# Patient Record
Sex: Female | Born: 1985 | Race: White | Hispanic: No | Marital: Single | Smoking: Current every day smoker
Health system: Southern US, Community
[De-identification: ages and names within clinical notes are randomized; demographics above are authoritative.]

## PROBLEM LIST (undated history)

## (undated) ENCOUNTER — Inpatient Hospital Stay (HOSPITAL_COMMUNITY): Payer: Self-pay

## (undated) DIAGNOSIS — R51 Headache: Secondary | ICD-10-CM

## (undated) DIAGNOSIS — R569 Unspecified convulsions: Secondary | ICD-10-CM

## (undated) DIAGNOSIS — O139 Gestational [pregnancy-induced] hypertension without significant proteinuria, unspecified trimester: Secondary | ICD-10-CM

## (undated) DIAGNOSIS — F32A Depression, unspecified: Secondary | ICD-10-CM

## (undated) DIAGNOSIS — F445 Conversion disorder with seizures or convulsions: Secondary | ICD-10-CM

## (undated) DIAGNOSIS — F329 Major depressive disorder, single episode, unspecified: Secondary | ICD-10-CM

---
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
## 2005-12-15 ENCOUNTER — Observation Stay: Payer: Self-pay | Admitting: Obstetrics and Gynecology

## 2005-12-15 ENCOUNTER — Inpatient Hospital Stay (HOSPITAL_COMMUNITY): Admission: AD | Admit: 2005-12-15 | Discharge: 2005-12-17 | Payer: Self-pay | Admitting: Gynecology

## 2005-12-25 ENCOUNTER — Inpatient Hospital Stay (HOSPITAL_COMMUNITY): Admission: AD | Admit: 2005-12-25 | Discharge: 2006-01-29 | Payer: Self-pay | Admitting: Obstetrics and Gynecology

## 2006-01-26 ENCOUNTER — Encounter (INDEPENDENT_AMBULATORY_CARE_PROVIDER_SITE_OTHER): Payer: Self-pay | Admitting: Specialist

## 2006-03-22 ENCOUNTER — Other Ambulatory Visit: Admission: RE | Admit: 2006-03-22 | Discharge: 2006-03-22 | Payer: Self-pay | Admitting: Obstetrics and Gynecology

## 2007-06-14 ENCOUNTER — Emergency Department: Payer: Self-pay | Admitting: Emergency Medicine

## 2007-07-29 ENCOUNTER — Emergency Department (HOSPITAL_COMMUNITY): Admission: EM | Admit: 2007-07-29 | Discharge: 2007-07-29 | Payer: Self-pay | Admitting: Emergency Medicine

## 2008-03-16 ENCOUNTER — Emergency Department (HOSPITAL_COMMUNITY): Admission: EM | Admit: 2008-03-16 | Discharge: 2008-03-16 | Payer: Self-pay | Admitting: Emergency Medicine

## 2008-04-16 ENCOUNTER — Emergency Department (HOSPITAL_COMMUNITY): Admission: EM | Admit: 2008-04-16 | Discharge: 2008-04-16 | Payer: Self-pay | Admitting: Diagnostic Radiology

## 2008-04-18 ENCOUNTER — Emergency Department (HOSPITAL_COMMUNITY): Admission: EM | Admit: 2008-04-18 | Discharge: 2008-04-18 | Payer: Self-pay | Admitting: Emergency Medicine

## 2008-11-08 ENCOUNTER — Emergency Department: Payer: Self-pay | Admitting: Emergency Medicine

## 2009-06-21 ENCOUNTER — Emergency Department (HOSPITAL_COMMUNITY): Admission: EM | Admit: 2009-06-21 | Discharge: 2009-06-21 | Payer: Self-pay | Admitting: Emergency Medicine

## 2009-09-08 ENCOUNTER — Emergency Department (HOSPITAL_COMMUNITY): Admission: EM | Admit: 2009-09-08 | Discharge: 2009-09-08 | Payer: Self-pay | Admitting: Emergency Medicine

## 2009-09-15 ENCOUNTER — Emergency Department: Payer: Self-pay | Admitting: Emergency Medicine

## 2010-01-21 ENCOUNTER — Emergency Department (HOSPITAL_COMMUNITY): Admission: EM | Admit: 2010-01-21 | Discharge: 2010-01-22 | Payer: Self-pay | Admitting: Emergency Medicine

## 2010-04-04 ENCOUNTER — Emergency Department (HOSPITAL_COMMUNITY)
Admission: EM | Admit: 2010-04-04 | Discharge: 2010-04-04 | Payer: Self-pay | Source: Home / Self Care | Admitting: Emergency Medicine

## 2010-06-17 LAB — DIFFERENTIAL
Basophils Absolute: 0 10*3/uL (ref 0.0–0.1)
Eosinophils Relative: 4 % (ref 0–5)
Lymphocytes Relative: 31 % (ref 12–46)
Neutro Abs: 5.4 10*3/uL (ref 1.7–7.7)
Neutrophils Relative %: 58 % (ref 43–77)

## 2010-06-17 LAB — CBC
Platelets: 210 10*3/uL (ref 150–400)
RBC: 4.46 MIL/uL (ref 3.87–5.11)
RDW: 13.2 % (ref 11.5–15.5)
WBC: 9.3 10*3/uL (ref 4.0–10.5)

## 2010-06-17 LAB — BASIC METABOLIC PANEL
BUN: 7 mg/dL (ref 6–23)
Calcium: 9 mg/dL (ref 8.4–10.5)
Creatinine, Ser: 0.63 mg/dL (ref 0.4–1.2)
GFR calc Af Amer: >60 mL/min (ref >60)
GFR calc non Af Amer: >60 mL/min (ref >60)

## 2010-06-17 LAB — ACETAMINOPHEN LEVEL: Acetaminophen (Tylenol), Serum: <10 ug/mL — ABNORMAL LOW (ref 10–30)

## 2010-06-17 LAB — RAPID URINE DRUG SCREEN, HOSP PERFORMED
Barbiturates: NOT DETECTED
Cocaine: NOT DETECTED
Opiates: NOT DETECTED

## 2010-06-17 LAB — URINALYSIS, ROUTINE W REFLEX MICROSCOPIC
Glucose, UA: NEGATIVE mg/dL
Leukocytes, UA: NEGATIVE
Nitrite: NEGATIVE
Specific Gravity, Urine: 1.01 (ref 1.005–1.030)
pH: 5.5 (ref 5.0–8.0)

## 2010-06-17 LAB — PREGNANCY, URINE: Preg Test, Ur: NEGATIVE

## 2010-06-17 LAB — ETHANOL: Alcohol, Ethyl (B): 83 mg/dL — ABNORMAL HIGH (ref 0–10)

## 2010-06-17 LAB — SALICYLATE LEVEL: Salicylate Lvl: <4 mg/dL (ref 2.8–20.0)

## 2010-06-22 LAB — SALICYLATE LEVEL: Salicylate Lvl: <4 mg/dL (ref 2.8–20.0)

## 2010-06-22 LAB — BASIC METABOLIC PANEL
BUN: 11 mg/dL (ref 6–23)
Chloride: 105 mEq/L (ref 96–112)
GFR calc non Af Amer: >60 mL/min (ref >60)
Glucose, Bld: 145 mg/dL — ABNORMAL HIGH (ref 70–99)
Potassium: 2.9 mEq/L — ABNORMAL LOW (ref 3.5–5.1)

## 2010-06-22 LAB — RAPID URINE DRUG SCREEN, HOSP PERFORMED
Amphetamines: NOT DETECTED
Barbiturates: NOT DETECTED
Benzodiazepines: POSITIVE — AB
Tetrahydrocannabinol: POSITIVE — AB

## 2010-06-22 LAB — DIFFERENTIAL
Basophils Absolute: 0 10*3/uL (ref 0.0–0.1)
Eosinophils Absolute: 0.4 10*3/uL (ref 0.0–0.7)
Eosinophils Relative: 3 % (ref 0–5)

## 2010-06-22 LAB — CBC
HCT: 39.6 % (ref 36.0–46.0)
MCV: 96.2 fL (ref 78.0–100.0)
Platelets: 207 10*3/uL (ref 150–400)
RDW: 12.6 % (ref 11.5–15.5)

## 2010-06-22 LAB — ACETAMINOPHEN LEVEL: Acetaminophen (Tylenol), Serum: <10 ug/mL — ABNORMAL LOW (ref 10–30)

## 2010-06-28 LAB — DIFFERENTIAL
Basophils Absolute: 0 10*3/uL (ref 0.0–0.1)
Basophils Relative: 1 % (ref 0–1)
Eosinophils Absolute: 0 10*3/uL (ref 0.0–0.7)
Eosinophils Relative: 0 % (ref 0–5)
Lymphocytes Relative: 14 % (ref 12–46)
Lymphs Abs: 1.5 10*3/uL (ref 0.7–4.0)
Monocytes Absolute: 0.9 10*3/uL (ref 0.1–1.0)
Monocytes Relative: 9 % (ref 3–12)
Neutro Abs: 8.1 10*3/uL — ABNORMAL HIGH (ref 1.7–7.7)
Neutrophils Relative %: 76 % (ref 43–77)

## 2010-06-28 LAB — URINALYSIS, ROUTINE W REFLEX MICROSCOPIC
Bilirubin Urine: NEGATIVE
Glucose, UA: NEGATIVE mg/dL
Hgb urine dipstick: NEGATIVE
Leukocytes, UA: NEGATIVE
pH: 7 (ref 5.0–8.0)

## 2010-06-28 LAB — URINE MICROSCOPIC-ADD ON

## 2010-06-28 LAB — COMPREHENSIVE METABOLIC PANEL
ALT: 14 U/L (ref 0–35)
AST: 23 U/L (ref 0–37)
Albumin: 4.3 g/dL (ref 3.5–5.2)
Alkaline Phosphatase: 50 U/L (ref 39–117)
BUN: 12 mg/dL (ref 6–23)
CO2: 23 mEq/L (ref 19–32)
Calcium: 9 mg/dL (ref 8.4–10.5)
Chloride: 106 mEq/L (ref 96–112)
Creatinine, Ser: 0.75 mg/dL (ref 0.4–1.2)
GFR calc Af Amer: >60 mL/min (ref >60)
GFR calc non Af Amer: >60 mL/min (ref >60)
Glucose, Bld: 81 mg/dL (ref 70–99)
Potassium: 3.3 mEq/L — ABNORMAL LOW (ref 3.5–5.1)
Sodium: 143 mEq/L (ref 135–145)
Total Bilirubin: 1 mg/dL (ref 0.3–1.2)
Total Protein: 6.7 g/dL (ref 6.0–8.3)

## 2010-06-28 LAB — RAPID URINE DRUG SCREEN, HOSP PERFORMED
Amphetamines: NOT DETECTED
Barbiturates: NOT DETECTED
Benzodiazepines: POSITIVE — AB
Cocaine: POSITIVE — AB
Opiates: NOT DETECTED
Tetrahydrocannabinol: POSITIVE — AB

## 2010-06-28 LAB — SALICYLATE LEVEL: Salicylate Lvl: <4 mg/dL (ref 2.8–20.0)

## 2010-06-28 LAB — CBC
HCT: 41.1 % (ref 36.0–46.0)
Platelets: 211 10*3/uL (ref 150–400)
RDW: 13 % (ref 11.5–15.5)

## 2010-06-28 LAB — ETHANOL: Alcohol, Ethyl (B): <5 mg/dL (ref 0–10)

## 2010-06-28 LAB — ACETAMINOPHEN LEVEL: Acetaminophen (Tylenol), Serum: <10 ug/mL — ABNORMAL LOW (ref 10–30)

## 2010-07-20 LAB — URINE MICROSCOPIC-ADD ON

## 2010-07-20 LAB — URINALYSIS, ROUTINE W REFLEX MICROSCOPIC
Glucose, UA: NEGATIVE mg/dL
Leukocytes, UA: NEGATIVE
pH: 6 (ref 5.0–8.0)

## 2010-08-21 ENCOUNTER — Emergency Department (HOSPITAL_COMMUNITY): Payer: Medicaid Other

## 2010-08-21 ENCOUNTER — Inpatient Hospital Stay (HOSPITAL_COMMUNITY)
Admission: EM | Admit: 2010-08-21 | Discharge: 2010-08-22 | DRG: 101 | Disposition: A | Discharge status: Home / Self Care | Payer: Medicaid Other | Attending: Internal Medicine | Admitting: Internal Medicine

## 2010-08-21 DIAGNOSIS — R569 Unspecified convulsions: Principal | ICD-10-CM | POA: Diagnosis present

## 2010-08-21 DIAGNOSIS — N390 Urinary tract infection, site not specified: Secondary | ICD-10-CM | POA: Diagnosis present

## 2010-08-21 DIAGNOSIS — F1010 Alcohol abuse, uncomplicated: Secondary | ICD-10-CM | POA: Diagnosis present

## 2010-08-21 DIAGNOSIS — R42 Dizziness and giddiness: Secondary | ICD-10-CM | POA: Diagnosis present

## 2010-08-21 DIAGNOSIS — F319 Bipolar disorder, unspecified: Secondary | ICD-10-CM | POA: Diagnosis present

## 2010-08-21 LAB — ACETAMINOPHEN LEVEL: Acetaminophen (Tylenol), Serum: <15 ug/mL (ref 10–30)

## 2010-08-21 LAB — DIFFERENTIAL
Eosinophils Absolute: 0.2 10*3/uL (ref 0.0–0.7)
Eosinophils Relative: 3 % (ref 0–5)
Lymphs Abs: 2.7 10*3/uL (ref 0.7–4.0)
Monocytes Relative: 6 % (ref 3–12)

## 2010-08-21 LAB — URINALYSIS, ROUTINE W REFLEX MICROSCOPIC
Glucose, UA: NEGATIVE mg/dL
Ketones, ur: NEGATIVE mg/dL
Leukocytes, UA: NEGATIVE
Nitrite: POSITIVE — AB
Specific Gravity, Urine: 1.015 (ref 1.005–1.030)
pH: 6 (ref 5.0–8.0)

## 2010-08-21 LAB — COMPREHENSIVE METABOLIC PANEL
AST: 26 U/L (ref 0–37)
Albumin: 4.2 g/dL (ref 3.5–5.2)
Chloride: 108 mEq/L (ref 96–112)
Creatinine, Ser: 0.53 mg/dL (ref 0.4–1.2)
GFR calc Af Amer: >60 mL/min (ref >60)
Total Bilirubin: 0.9 mg/dL (ref 0.3–1.2)
Total Protein: 7.4 g/dL (ref 6.0–8.3)

## 2010-08-21 LAB — RAPID URINE DRUG SCREEN, HOSP PERFORMED
Benzodiazepines: POSITIVE — AB
Tetrahydrocannabinol: POSITIVE — AB

## 2010-08-21 LAB — CBC
MCH: 32.3 pg (ref 26.0–34.0)
MCV: 94.2 fL (ref 78.0–100.0)
Platelets: 250 10*3/uL (ref 150–400)
RBC: 4.46 MIL/uL (ref 3.87–5.11)
RDW: 13.6 % (ref 11.5–15.5)

## 2010-08-21 LAB — URINE MICROSCOPIC-ADD ON

## 2010-08-21 NOTE — Discharge Summary (Signed)
Gabriela Taylor, Gabriela Taylor             ACCOUNT NO.:  [id number]   MEDICAL RECORD NO.:  [id number]          PATIENT TYPE:  INP   LOCATION:  9149                          [facility]   PHYSICIAN:  James A. Ashley Royalty, M.D.DATE OF BIRTH:  August 02, 1985   DATE OF ADMISSION:  12/15/2005  DATE OF DISCHARGE:  12/17/2005                                 DISCHARGE SUMMARY   DISCHARGE DIAGNOSIS:  1. Intrauterine pregnancy at 30 weeks' gestation, undelivered.  2. Placenta previa  3. Preterm contractions.  4. Bleeding -- secondary to #2.   OPERATIONS/SPECIAL PROCEDURES:  None.   CONSULTATIONS:  None.   DISCHARGE MEDICATIONS:  1. Terbutaline 5 mg every 4 hours.  2. Prenatal vitamins.   HISTORY AND PHYSICAL:  This is a 25 year old gravida 1 at approximately 47  weeks' gestation.  The patient experienced bleeding while at [facility] in  the [facility] area near  [place].  She chose to go to [facility] on December 15, 2005.  She was subsequently noted to be stable and  subsequently was transferred to [facility] later on the same day.  She  was given one dose of steroids at [facility] prior to transfer.  For  the remainder of the history and physical please see chart.   HOSPITAL COURSE:  The patient was admitted to [facility] in  [place].  Admission laboratory studies were drawn.  She was given  terbutaline by Dr. Sydnee Cabal and also a second dose of betamethasone on  December 16, 2005.  The patient's contractions abated.  Her bleeding abated  as well.  On December 17, 2005 she was felt to be stable for discharge and  was discharged home afebrile and in satisfactory condition.   ACCESSORY CLINICAL FINDINGS:  Hemoglobin on December 15, 2005 at [facility]  revealed a value of 12.9.   DISPOSITION:  The patient is to return to [facility] and Obstetrics  in approximately 1-2 weeks for followup.  Will obtain ultrasound in near  future to determine if the  placenta previa persists as it is known not to be  a central previa.  Full precautions were given including avoidance of any  sexual activity whatsoever, staying close to [facility], and to report  any significant vaginal bleeding.  The patient states she understands and  accepts and will comply.     James A. Ashley Royalty, M.D.  Electronically Signed    JAM/MEDQ  D:  12/17/2005  T:  12/18/2005  Job:  [id number]

## 2010-08-21 NOTE — Discharge Summary (Signed)
Gabriela Taylor, Gabriela Taylor             ACCOUNT NO.:  [id number]   MEDICAL RECORD NO.:  [id number]          PATIENT TYPE:  INP   LOCATION:  9124                          [facility]   PHYSICIAN:  James A. Ashley Royalty, M.D.DATE OF BIRTH:  1985/11/14   DATE OF ADMISSION:  12/25/2005  DATE OF DISCHARGE:  01/29/2006                               DISCHARGE SUMMARY   DISCHARGE DIAGNOSES:  1. Intrauterine pregnancy at 36 weeks, delivered.  2. Placenta previa.   OPERATIONS AND PROCEDURES:  Primary low transverse cesarean section.   CONSULTATIONS:  Dr. Margot Ables (maternal fetal medicine).   DISCHARGE MEDICATIONS:  Percocet and Motrin 600 mg.   HISTORY AND PHYSICAL:  This is a 26 year old primigravida at 31-weeks 3-  days gestation at the time of admission.  The patient was admitted with  her second episode of vaginal bleeding pursuant to placenta previa.  She  had previously received betamethasone.  For the remainder of the history  and physical, please see chart.   HOSPITAL COURSE:  The patient was admitted to [facility] of  [place].  Initial laboratory studies were drawn.  Maternal fetal  medicine was consulted on December 29, 2005.  Decision was made to  maintain the patient in the hospital until delivery.  It was further  agreed that if she made it to 36 weeks she would undergo primary low  transverse cesarean section.  As she did make it to 36 weeks, she was  taken to the operating room on January 26, 2006 and underwent primary  low transverse cesarean section.  Procedure was accomplished by Dr.  Sylvester Harder with Dr. Richardson Dopp assisting.  It yielded a 5-pound 1-ounce  female, Apgars 8 at one minute and 9 at five minutes, sent to the newborn  nursery.  The patient's postoperative course was benign.  She was  discharged on the third postoperative day afebrile and in satisfactory  condition.   DISPOSITION:  The patient is to return to [facility] and  Obstetrics in 4-6 weeks  for postpartum evaluation.      James A. Ashley Royalty, M.D.  Electronically Signed     JAM/MEDQ  D:  03/10/2006  T:  03/10/2006  Job:  [id number]

## 2010-08-21 NOTE — Op Note (Signed)
NAMEGEARLDINE, Gabriela Taylor             ACCOUNT NO.:  [id number]   MEDICAL RECORD NO.:  [id number]          PATIENT TYPE:  INP   LOCATION:  NA                            [facility]   PHYSICIAN:  James A. Ashley Royalty, M.D.DATE OF BIRTH:  [birth year]   DATE OF PROCEDURE:  01/26/2006  DATE OF DISCHARGE:                                 OPERATIVE REPORT   PREOPERATIVE DIAGNOSES:  1. Intrauterine pregnancy at 94 with weeks' gestation.  2. Placenta previa.   POSTOPERATIVE DIAGNOSES:  1. Intrauterine pregnancy at 75 with weeks' gestation.  2. Placenta previa.  3. Path pending.   PROCEDURE:  Primary low transverse cesarean section.   SURGEON:  Rudy Jew. Ashley Royalty, M.D.   ASSISTANT:  Gerald Leitz, MD.   FINDINGS:  A 5 pound 1 ounce female, Apgars 8 at 1 minute and 9 at 5 minutes,  sent to newborn nursery.   ESTIMATED BLOOD LOSS:  600 mL.   COMPLICATIONS:  None.   PACKS AND DRAINS:  [place].   Sponge, needle and instrument count reported as correct x2.   PROCEDURE:  The patient was taken to the operating room and placed in the  sitting position.  After spinal anesthetic was administered, she was placed  in the dorsal supine position and prepped and draped in the usual manner for  abdominal surgery.  Pottenger catheter was placed.  A Pfannenstiel incision was  made down to the level of the fascia which was nicked with a knife and  incised transversely with Mayo scissors.  The underlying rectus muscles were  separated from the fascia using sharp and blunt dissection.  Rectus muscles  were separated in the midline exposing the peritoneum which was elevated  with hemostats and entered atraumatically with Metzenbaum scissors.  Incision was extended longitudinally.  The uterus was identified and bladder  flap created by incising the anterior uterine serosa and sharply and bluntly  dissecting the bladder inferiorly.  It was held in place with a bladder  blade.  The uterus was then entered through a low  transverse incision using  sharp and blunt dissection.  The placenta was not encountered as it was on  the posterior wall of the uterus.  The fluid was noted be clear.  The infant  was delivered from vertex presentation in an atraumatic manner.  The infant  was suctioned.  The cord was doubly clamped, cut and infant given  immediately to the awaiting pediatric team.  The cord blood was obtained and  placenta and membranes removed in their entirety and submitted to pathology  for histologic studies.  The uterus was exteriorized.  The uterus was then  closed in two running layers of #1 Vicryl.  The first was a running locking  layer.  The second was a running, intermittently locking, and imbricating  layer.  One additional figure-of-eight suture was required to obtain  hemostasis.  Hemostasis was noted.  Uterus, tubes and ovaries were inspected  and found to be otherwise normal.  They were returned to the abdominal  cavity.  Copious irrigation was accomplished.  Hemostasis was noted.  The  peritoneum was then closed with  3-0 Vicryl.  The fascia was closed with 0  Vicryl.  The skin was closed with staples.   The patient tolerated the procedure extremely well and was returned to the  recovery room in good condition.  At the conclusion of the procedure, the  urine was clear and copious.      James A. Ashley Royalty, M.D.  Electronically Signed     JAM/MEDQ  D:  01/26/2006  T:  01/27/2006  Job:  [id number]

## 2010-08-22 ENCOUNTER — Inpatient Hospital Stay (HOSPITAL_COMMUNITY): Payer: Medicaid Other

## 2010-08-22 LAB — CBC
HCT: 39.6 % (ref 36.0–46.0)
Hemoglobin: 13 g/dL (ref 12.0–15.0)
RBC: 4.14 MIL/uL (ref 3.87–5.11)
WBC: 6.9 10*3/uL (ref 4.0–10.5)

## 2010-08-22 LAB — COMPREHENSIVE METABOLIC PANEL
AST: 34 U/L (ref 0–37)
BUN: 9 mg/dL (ref 6–23)
CO2: 29 mEq/L (ref 19–32)
Calcium: 8.3 mg/dL — ABNORMAL LOW (ref 8.4–10.5)
Creatinine, Ser: 0.56 mg/dL (ref 0.4–1.2)
GFR calc Af Amer: >60 mL/min (ref >60)
GFR calc non Af Amer: >60 mL/min (ref >60)
Total Bilirubin: 0.4 mg/dL (ref 0.3–1.2)

## 2010-08-22 LAB — DIFFERENTIAL
Basophils Absolute: 0 10*3/uL (ref 0.0–0.1)
Basophils Relative: 1 % (ref 0–1)
Lymphocytes Relative: 37 % (ref 12–46)
Neutro Abs: 3.3 10*3/uL (ref 1.7–7.7)
Neutrophils Relative %: 48 % (ref 43–77)

## 2010-08-22 LAB — MAGNESIUM: Magnesium: 1.7 mg/dL (ref 1.5–2.5)

## 2010-08-22 LAB — PROLACTIN: Prolactin: 10 ng/mL

## 2010-08-22 NOTE — H&P (Signed)
Gabriela Taylor, CORDOBA             ACCOUNT NO.:  [id number]  MEDICAL RECORD NO.:  [id number]           PATIENT TYPE:  I  LOCATION:  IC03                          [facility]  PHYSICIAN:  Vania Rea, M.D. DATE OF BIRTH:  February 18, 1986  DATE OF ADMISSION:  08/21/2010 DATE OF DISCHARGE:  LH                             HISTORY & PHYSICAL   PRIMARY CARE PHYSICIAN:  Unassigned.  DICTATING PHYSICIAN:  Vania Rea, MD  CHIEF COMPLAINT:  Recurrent seizures.  HISTORY OF PRESENT ILLNESS:  This is a 25 year old Caucasian lady with a history of bipolar disorder, noncompliant with medications, also a self- reported history of hypertension.  The patient says she has not visited psychiatrist in over a year, is not sure she needs to take any medications.  On initially approaching this lady, she refused to cooperate with the interview, was using extremely foul language insisting that we find out what is wrong with her and she did not wish to talk with me, but that her boyfriend who is present could talk.  We note that the patient presented in a similar manner in October 2011, refused to cooperate with the emergency room physician at that time, but allowed her mother to speak.  At that time, was diagnosed with pseudoseizures, Behavioral Health referral was recommended, but the patient declined.  On this occasion, the patient became particularly agitated and verbally abusive when questions were asked about her alcohol and drug use.  The patient's boyfriend gives a history that she has been drinking today and that he does not feel she has been drinking enough to get drunk, but that she has been having periods of what he describes as seizure-like activity involving mostly stiffening out with some jerking movements and some choking movement.  The stiffening and rigidity of her body lasted maybe for about 5 minutes with jerking movements in between, she had about 3 episodes of these he  feels and EMS was called.  EMS reports similar episode.  There were no biting of her tongue.  No incontinence of urine.  Nurses report that on arrival into the emergency room via ambulance, the patient had already received intramuscular Ativan, but was able to get up from the stretcher, was running around cursing and abusive, was eventually became calm and when she was seen by the emergency room physician, was not responding and was felt by the emergency room physician to be postictal.  No seizure activity was observed in the emergency room.  Because of the history of seizure disorder, the Hospitalist Service was called to evaluate the patient. There was also concerned because the chest x-ray suggested that she may have been aspirating.  As noted above, the patient refused to cooperate with the interview.  PAST MEDICAL HISTORY: 1. Hypertension. 2. Bipolar disorder, not on medication.  MEDICATIONS:  None.  ALLERGIES:  NO KNOWN DRUG ALLERGIES.  SOCIAL HISTORY:  Smokes half a pack per day.  Drinks alcohol episodically according to her boyfriend.  Uses marijuana.  Denies illicit drug use.  FAMILY HISTORY:  Unable to obtain because of the patient's refusal.  REVIEW OF SYSTEMS:  Other than noted  above.  Complains of severe throbbing "migraine headaches."  PHYSICAL EXAMINATION:  GENERAL:  Healthy-looking young Caucasian lady reclining in the stretcher. VITALS:  Temperature is 98, her pulse is 94, respirations 20, blood pressure 130/117.  She is saturating at 96% on room air. HEENT:  Her pupils are round and equal.  Mucous membranes pink. Anicteric.  She is not dehydrated.  No cervical lymphadenopathy or thyromegaly.  No carotid bruit.  No jugular venous distention. CHEST:  She refuses she does not cooperate with physical examination of the chest in that she does not breathe, so no crackles or rhonchi heard. CARDIOVASCULAR SYSTEM:  Regular rhythm.  No murmur. ABDOMEN:  Soft and  nontender. EXTREMITIES:  Without edema. CENTRAL NERVOUS SYSTEM:  Cranial nerves II-XII are grossly intact.  She has no focal lateralizing signs.  LABORATORY DATA:  Her alcohol level is 165.  Her salicylate level is undetectable.  Her acetaminophen level undetectable.  Her CBC is unremarkable with a white count at 8.7, hemoglobin 14.4, platelets 250 with a normal differential.  Her sodium is 135, potassium 3.5, chloride 108, CO2 27, glucose 95, BUN 8, creatinine 0.5, calcium 9.5.  Her liver functions are completely normal.  Urine toxicology is positive for benzodiazepines and tetrahydrocannabinol.  Urinalysis shows clear urine, specific gravity 1.015, negative for ketones or proteins, positive for nitrites, negative for leukocyte esterase.  Microscopy shows 0-2 white cells, many bacteria and rare epithelial cells.  CT scan of the head degraded by patient's motion, but no abnormality was found.  Chest x-ray was reported has bibasilar airspace disease may be atelectasis or pneumonia, question aspiration.  ASSESSMENT: 1. 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 2. Bipolar disorder with agitation. 3. Alcohol intoxication. 4. Abnormal chest x-ray, probable misreading of chest x-ray. 5. Tobacco abuse. 6. Alcohol abuse. 7. Marijuana abuse. 8. Probable urinary tract infection.  PLAN:  We will bring this lady on observation, observe her in the Intensive Care, we will hydrate her, I give Tylenol for headache p.r.n., Ativan if she should have any seizures.  We will culture her urine and start her on Rocephin for possible urinary tract infection.  We will get a CT scan of the chest to further characterize abnormal chest x-ray.  We will refer this lady to the social worker for assistance with insurance issues and to see if we consider the psychiatrist or Behavioral Health to help to manage her bipolar disorder.  Other plans as per orders.     Vania Rea, M.D.     LC/MEDQ   D:  08/22/2010  T:  08/22/2010  Job:  [id number]  cc:   Dr. Lodema Hong Psychiatrist in [place]  Electronically Signed by Vania Rea M.D. on 08/22/2010 09:42:49 AM

## 2010-08-22 NOTE — Discharge Summary (Signed)
  Gabriela Taylor, Gabriela Taylor             ACCOUNT NO.:  [id number]  MEDICAL RECORD NO.:  [id number]           PATIENT TYPE:  I  LOCATION:  IC03                          [facility]AC[facility]:  [facility]  PHYSICIAN:  Wilson Singer, M.D.DATE OF BIRTH:  10/25/1985  DATE OF ADMISSION:  08/21/2010 DATE OF DISCHARGE:  05/19/2012LH                              DISCHARGE SUMMARY   FINAL DISCHARGE DIAGNOSES: 1. Pseudoseizure. 2. Bipolar disorder. 3. Possible urinary tract infection.  CONDITION ON DISCHARGE:  Stable.  MEDICATIONS ON DISCHARGE: 1. Ciprofloxacin 250 mg b.i.d. for 5 days. 2. Meclizine 12.5 mg daily p.r.n., total of 10 tablets.  HISTORY:  This 25 year old lady was admitted with "seizures" when she apparently had an episode where she had stiffening and rigidity of her body lasting about 5 minutes with jerking movements in between.  She has had episodes like this before and clinical suspicion was that this would be really pseudoseizure rather than seizures.  Please see initial history and physical examination done by Dr. Vania Rea.  HOSPITAL PROGRESS:  The patient has had no seizures overnight in the intensive care unit, whatsoever.  CT scan of her brain was negative.  CT scan of her chest was negative for any pneumonia as had been possibly suggested by chest x-ray.  She does not have any cough, fever, or raised white count to suggest any kind of infectious process.  PHYSICAL EXAMINATION:  VITAL SIGNS:  Today, temperature 97.8, blood pressure 108/57, pulse 88, saturation 98% on room air. GENERAL:  She looks systemically well.  She is alert and oriented without any focal neurologic signs. HEART:  Heart sounds are present and normal. LUNGS:  Lung fields are clear.  INVESTIGATIONS:  Hemoglobin 13.0, white blood cell count 6.9, platelets 223.  Magnesium 1.7.  Sodium 139, potassium 3.5, bicarbonate 29, BUN 9, creatinine 0.56.  Liver enzymes all normal.  Urine microscopy shows  many bacteria and urinalysis is positive for nitrite.  Interestingly, her alcohol level was 165 yesterday when she was admitted, and she was positive for tetrahydrocannabinol in the urine drug screen.  DISPOSITION:  The patient is stable to be discharged home today, and I have given her prescriptions for ciprofloxacin for possible UTI and some meclizine as she describes some chronic dizziness that she sometimes gets.  I have urged her that she needs to go to see a psychiatrist who can help her with issues relating to pseudoseizure and bipolar disorder.     Wilson Singer, M.D.     NCG/MEDQ  D:  08/22/2010  T:  08/22/2010  Job:  [id number]  Electronically Signed by Lilly Cove M.D. on 08/22/2010 05:04:07 PM

## 2010-08-24 LAB — URINE CULTURE: Colony Count: >=100000

## 2010-08-28 ENCOUNTER — Emergency Department (HOSPITAL_COMMUNITY): Payer: Medicaid Other

## 2010-08-28 ENCOUNTER — Emergency Department (HOSPITAL_COMMUNITY)
Admission: EM | Admit: 2010-08-28 | Discharge: 2010-08-29 | Disposition: A | Discharge status: Home / Self Care | Payer: Medicaid Other | Attending: Emergency Medicine | Admitting: Emergency Medicine

## 2010-08-28 DIAGNOSIS — F319 Bipolar disorder, unspecified: Secondary | ICD-10-CM | POA: Insufficient documentation

## 2010-08-28 DIAGNOSIS — G43909 Migraine, unspecified, not intractable, without status migrainosus: Secondary | ICD-10-CM | POA: Insufficient documentation

## 2010-08-28 DIAGNOSIS — I10 Essential (primary) hypertension: Secondary | ICD-10-CM | POA: Insufficient documentation

## 2010-08-28 DIAGNOSIS — Z79899 Other long term (current) drug therapy: Secondary | ICD-10-CM | POA: Insufficient documentation

## 2010-08-28 DIAGNOSIS — G40909 Epilepsy, unspecified, not intractable, without status epilepticus: Secondary | ICD-10-CM | POA: Insufficient documentation

## 2010-08-28 LAB — COMPREHENSIVE METABOLIC PANEL
AST: 17 U/L (ref 0–37)
Albumin: 3.8 g/dL (ref 3.5–5.2)
Alkaline Phosphatase: 62 U/L (ref 39–117)
CO2: 28 mEq/L (ref 19–32)
Chloride: 110 mEq/L (ref 96–112)
GFR calc Af Amer: >60 mL/min (ref >60)
GFR calc non Af Amer: >60 mL/min (ref >60)
Potassium: 3.4 mEq/L — ABNORMAL LOW (ref 3.5–5.1)
Total Bilirubin: 0.4 mg/dL (ref 0.3–1.2)

## 2010-08-28 LAB — DIFFERENTIAL
Basophils Relative: 0 % (ref 0–1)
Eosinophils Absolute: 0.2 10*3/uL (ref 0.0–0.7)
Lymphs Abs: 2.2 10*3/uL (ref 0.7–4.0)
Monocytes Absolute: 0.4 10*3/uL (ref 0.1–1.0)
Monocytes Relative: 6 % (ref 3–12)
Neutrophils Relative %: 56 % (ref 43–77)

## 2010-08-28 LAB — URINALYSIS, ROUTINE W REFLEX MICROSCOPIC
Bilirubin Urine: NEGATIVE
Glucose, UA: NEGATIVE mg/dL
Hgb urine dipstick: NEGATIVE
Ketones, ur: NEGATIVE mg/dL
Protein, ur: NEGATIVE mg/dL
Urobilinogen, UA: 0.2 mg/dL (ref 0.0–1.0)

## 2010-08-28 LAB — CBC
Hemoglobin: 13.1 g/dL (ref 12.0–15.0)
MCH: 32.4 pg (ref 26.0–34.0)
MCHC: 33.8 g/dL (ref 30.0–36.0)
MCV: 96 fL (ref 78.0–100.0)
Platelets: 238 10*3/uL (ref 150–400)
RBC: 4.04 MIL/uL (ref 3.87–5.11)

## 2010-08-28 LAB — RAPID URINE DRUG SCREEN, HOSP PERFORMED
Amphetamines: NOT DETECTED
Benzodiazepines: POSITIVE — AB
Cocaine: NOT DETECTED
Tetrahydrocannabinol: POSITIVE — AB

## 2010-08-28 LAB — TROPONIN I: Troponin I: <0.3 ng/mL (ref <0.30)

## 2010-08-28 LAB — CK TOTAL AND CKMB (NOT AT ARMC): Relative Index: INVALID (ref 0.0–2.5)

## 2010-09-28 ENCOUNTER — Emergency Department (HOSPITAL_COMMUNITY)
Admission: EM | Admit: 2010-09-28 | Discharge: 2010-09-28 | Disposition: A | Discharge status: Home / Self Care | Payer: Medicaid Other | Attending: Emergency Medicine | Admitting: Emergency Medicine

## 2010-09-28 ENCOUNTER — Emergency Department (HOSPITAL_COMMUNITY): Payer: Medicaid Other

## 2010-09-28 DIAGNOSIS — S9000XA Contusion of unspecified ankle, initial encounter: Secondary | ICD-10-CM | POA: Insufficient documentation

## 2010-09-28 DIAGNOSIS — S0990XA Unspecified injury of head, initial encounter: Secondary | ICD-10-CM | POA: Insufficient documentation

## 2010-09-28 DIAGNOSIS — S20229A Contusion of unspecified back wall of thorax, initial encounter: Secondary | ICD-10-CM | POA: Insufficient documentation

## 2010-09-28 LAB — URINALYSIS, ROUTINE W REFLEX MICROSCOPIC
Bilirubin Urine: NEGATIVE
Glucose, UA: NEGATIVE mg/dL
Urobilinogen, UA: 0.2 mg/dL (ref 0.0–1.0)

## 2010-09-28 LAB — URINE MICROSCOPIC-ADD ON

## 2010-09-29 ENCOUNTER — Emergency Department (HOSPITAL_COMMUNITY)
Admission: EM | Admit: 2010-09-29 | Discharge: 2010-09-29 | Disposition: A | Discharge status: Home / Self Care | Payer: Medicaid Other | Attending: Emergency Medicine | Admitting: Emergency Medicine

## 2010-09-29 DIAGNOSIS — G43909 Migraine, unspecified, not intractable, without status migrainosus: Secondary | ICD-10-CM | POA: Insufficient documentation

## 2010-12-29 LAB — STREP A DNA PROBE: Group A Strep Probe: NEGATIVE

## 2010-12-29 LAB — RAPID STREP SCREEN (MED CTR MEBANE ONLY): Streptococcus, Group A Screen (Direct): NEGATIVE

## 2011-01-13 LAB — OB RESULTS CONSOLE ABO/RH: RH Type: NEGATIVE

## 2011-01-13 LAB — OB RESULTS CONSOLE RUBELLA ANTIBODY, IGM: Rubella: IMMUNE

## 2011-01-13 LAB — OB RESULTS CONSOLE HEPATITIS B SURFACE ANTIGEN: Hepatitis B Surface Ag: NEGATIVE

## 2011-01-13 LAB — OB RESULTS CONSOLE ANTIBODY SCREEN: Antibody Screen: NEGATIVE

## 2011-02-04 ENCOUNTER — Other Ambulatory Visit: Payer: Self-pay | Admitting: Obstetrics & Gynecology

## 2011-02-04 ENCOUNTER — Other Ambulatory Visit (HOSPITAL_COMMUNITY)
Admission: RE | Admit: 2011-02-04 | Discharge: 2011-02-04 | Disposition: A | Discharge status: Home / Self Care | Payer: Medicaid Other | Source: Ambulatory Visit | Attending: Obstetrics & Gynecology | Admitting: Obstetrics & Gynecology

## 2011-02-04 DIAGNOSIS — Z01419 Encounter for gynecological examination (general) (routine) without abnormal findings: Secondary | ICD-10-CM | POA: Insufficient documentation

## 2011-04-06 NOTE — L&D Delivery Note (Addendum)
Delivery Note At 4:28 PM a viable female was delivered via VBAC, Spontaneous (Presentation ROA).  APGAR: 6, 9; weight - deferred .   Placenta status: Intact, Spontaneous.  Cord: 3 vessels with the following complications: None.  Cord pH: none  Patient given cytotec pr for postpartum hemorrhage.  Anesthesia: Epidural  Episiotomy: none Lacerations: 1st degree L periurethral  Suture Repair: 2.0 Est. Blood Loss (mL): 800  Mom to postpartum.  Baby to nursery-stable.  Andrena Mews, DO [facility] Resident - PGY-1 09/03/2011 5:11 PM    Delivery attended by me.  I agree with the above note. Levie Heritage, DO 09/03/2011 5:35 PM

## 2011-08-04 ENCOUNTER — Encounter (HOSPITAL_COMMUNITY): Payer: Self-pay

## 2011-08-04 ENCOUNTER — Inpatient Hospital Stay (HOSPITAL_COMMUNITY)
Admission: AD | Admit: 2011-08-04 | Discharge: 2011-08-04 | Disposition: A | Discharge status: Home / Self Care | Payer: Medicaid Other | Source: Ambulatory Visit | Attending: Obstetrics and Gynecology | Admitting: Obstetrics and Gynecology

## 2011-08-04 DIAGNOSIS — H538 Other visual disturbances: Secondary | ICD-10-CM | POA: Insufficient documentation

## 2011-08-04 DIAGNOSIS — R209 Unspecified disturbances of skin sensation: Secondary | ICD-10-CM

## 2011-08-04 DIAGNOSIS — R200 Anesthesia of skin: Secondary | ICD-10-CM

## 2011-08-04 DIAGNOSIS — O99891 Other specified diseases and conditions complicating pregnancy: Secondary | ICD-10-CM | POA: Insufficient documentation

## 2011-08-04 DIAGNOSIS — G589 Mononeuropathy, unspecified: Secondary | ICD-10-CM | POA: Insufficient documentation

## 2011-08-04 DIAGNOSIS — R42 Dizziness and giddiness: Secondary | ICD-10-CM | POA: Insufficient documentation

## 2011-08-04 HISTORY — DX: Depression, unspecified: F32.A

## 2011-08-04 HISTORY — DX: Major depressive disorder, single episode, unspecified: F32.9

## 2011-08-04 HISTORY — DX: Gestational (pregnancy-induced) hypertension without significant proteinuria, unspecified trimester: O13.9

## 2011-08-04 HISTORY — DX: Headache: R51

## 2011-08-04 LAB — URINE MICROSCOPIC-ADD ON

## 2011-08-04 LAB — COMPREHENSIVE METABOLIC PANEL
AST: 17 U/L (ref 0–37)
Albumin: 2.8 g/dL — ABNORMAL LOW (ref 3.5–5.2)
BUN: 6 mg/dL (ref 6–23)
CO2: 21 mEq/L (ref 19–32)
Calcium: 8.7 mg/dL (ref 8.4–10.5)
Chloride: 104 mEq/L (ref 96–112)
Creatinine, Ser: 0.41 mg/dL — ABNORMAL LOW (ref 0.50–1.10)
GFR calc non Af Amer: >90 mL/min (ref >90)
Total Bilirubin: 0.3 mg/dL (ref 0.3–1.2)

## 2011-08-04 LAB — CBC
Platelets: 140 10*3/uL — ABNORMAL LOW (ref 150–400)
RBC: 3.91 MIL/uL (ref 3.87–5.11)
RDW: 13 % (ref 11.5–15.5)
WBC: 11.8 10*3/uL — ABNORMAL HIGH (ref 4.0–10.5)

## 2011-08-04 LAB — URINALYSIS, ROUTINE W REFLEX MICROSCOPIC
Hgb urine dipstick: NEGATIVE
Protein, ur: NEGATIVE mg/dL
Specific Gravity, Urine: 1.025 (ref 1.005–1.030)
Urobilinogen, UA: 0.2 mg/dL (ref 0.0–1.0)

## 2011-08-04 MED ORDER — ACETAMINOPHEN 325 MG PO TABS
650.0000 mg | ORAL_TABLET | Freq: Once | ORAL | Status: AC
  Administered 2011-08-04: 650 mg via ORAL
  Filled 2011-08-04: qty 2

## 2011-08-04 MED ORDER — ZOLPIDEM TARTRATE 10 MG PO TABS: 10.0000 mg | ORAL_TABLET | Freq: Every evening | ORAL | Status: DC | PRN

## 2011-08-04 MED ORDER — ZOLPIDEM TARTRATE 10 MG PO TABS
10.0000 mg | ORAL_TABLET | Freq: Once | ORAL | Status: AC
  Administered 2011-08-04: 10 mg via ORAL
  Filled 2011-08-04: qty 1

## 2011-08-04 NOTE — MAU Provider Note (Signed)
History     CSN: [id number]  Arrival date and time: 08/04/11 4098   First Provider Initiated Contact with Patient 08/04/11 1852      Chief Complaint  Patient presents with  . Dizziness  . Blurred Vision   HPI This is a 26 year old G2P0101 at 35.3 weeks who presents to the MAU after an episode of dizziness, blurred vision that started earlier this afternoon with left sided numbness that has slowly resolved to left leg numbness.  FOB was present during episode of numbness and denies facial droop, slurred speech, aphasia, weakness.  Patient also admits to a headache.  OB History    Grav Para Term Preterm Abortions TAB SAB Ect Mult Living   2 1  1      1       Past Medical History  Diagnosis Date  . Depression   . Pregnancy induced hypertension   . Preterm labor   . Headache     Past Surgical History  Procedure Date  . Cesarean section     Family History  Problem Relation Age of Onset  . Anesthesia problems Neg Hx   . Hypotension Neg Hx   . Malignant hyperthermia Neg Hx   . Pseudochol deficiency Neg Hx     History  Substance Use Topics  . Smoking status: Current Everyday Smoker -- 0.5 packs/day    Types: Cigarettes  . Smokeless tobacco: Never Used  . Alcohol Use: No    Allergies: No Known Allergies  Prescriptions prior to admission  Medication Sig Dispense Refill  . acetaminophen (TYLENOL) 325 MG tablet Take 650 mg by mouth every 6 (six) hours as needed. Head, stomach ache        Review of Systems  All other systems reviewed and are negative.   Physical Exam   Blood pressure 136/72, pulse 85, temperature 97.6 F (36.4 C), temperature source Oral, resp. rate 18, height 5\' 2"  (1.575 m), weight 68.947 kg (152 lb), last menstrual period 08/18/2010, SpO2 98.00%.  Physical Exam  Constitutional: She is oriented to person, place, and time. She appears well-developed and well-nourished.  Respiratory: Effort normal.  GI: Soft. Bowel sounds are normal. She  exhibits no distension and no mass. There is no tenderness. There is no rebound and no guarding.  Musculoskeletal: Normal range of motion.  Neurological: She is alert and oriented to person, place, and time. She has normal reflexes.       Strength normal.  CN 2-12 grossly intact.  Skin: Skin is warm and dry.  Psychiatric: She has a normal mood and affect. Her behavior is normal. Judgment and thought content normal.   Results for orders placed during the hospital encounter of 08/04/11 (from the past 24 hour(s))  COMPREHENSIVE METABOLIC PANEL     Status: Abnormal   Collection Time   08/04/11  7:10 PM      Component Value Range   Sodium 137  135 - 145 (mEq/L)   Potassium 3.3 (*) 3.5 - 5.1 (mEq/L)   Chloride 104  96 - 112 (mEq/L)   CO2 21  19 - 32 (mEq/L)   Glucose, Bld 77  70 - 99 (mg/dL)   BUN 6  6 - 23 (mg/dL)   Creatinine, Ser 1.19 (*) 0.50 - 1.10 (mg/dL)   Calcium 8.7  8.4 - 14.7 (mg/dL)   Total Protein 6.4  6.0 - 8.3 (g/dL)   Albumin 2.8 (*) 3.5 - 5.2 (g/dL)   AST 17  0 - 37 (U/L)  ALT 11  0 - 35 (U/L)   Alkaline Phosphatase 134 (*) 39 - 117 (U/L)   Total Bilirubin 0.3  0.3 - 1.2 (mg/dL)   GFR calc non Af Amer >90  >90 (mL/min)   GFR calc Af Amer >90  >90 (mL/min)  CBC     Status: Abnormal   Collection Time   08/04/11  7:10 PM      Component Value Range   WBC 11.8 (*) 4.0 - 10.5 (K/uL)   RBC 3.91  3.87 - 5.11 (MIL/uL)   Hemoglobin 13.0  12.0 - 15.0 (g/dL)   HCT 19.1  47.8 - 29.5 (%)   MCV 96.7  78.0 - 100.0 (fL)   MCH 33.2  26.0 - 34.0 (pg)   MCHC 34.4  30.0 - 36.0 (g/dL)   RDW 62.1  30.8 - 65.7 (%)   Platelets 140 (*) 150 - 400 (K/uL)  URINALYSIS, ROUTINE W REFLEX MICROSCOPIC     Status: Abnormal   Collection Time   08/04/11  7:12 PM      Component Value Range   Color, Urine YELLOW  YELLOW    APPearance CLOUDY (*) CLEAR    Specific Gravity, Urine 1.025  1.005 - 1.030    pH 7.0  5.0 - 8.0    Glucose, UA NEGATIVE  NEGATIVE (mg/dL)   Hgb urine dipstick NEGATIVE  NEGATIVE     Bilirubin Urine NEGATIVE  NEGATIVE    Ketones, ur 15 (*) NEGATIVE (mg/dL)   Protein, ur NEGATIVE  NEGATIVE (mg/dL)   Urobilinogen, UA 0.2  0.0 - 1.0 (mg/dL)   Nitrite NEGATIVE  NEGATIVE    Leukocytes, UA SMALL (*) NEGATIVE   URINE MICROSCOPIC-ADD ON     Status: Abnormal   Collection Time   08/04/11  7:12 PM      Component Value Range   Squamous Epithelial / LPF MANY (*) RARE    WBC, UA 0-2  <3 (WBC/hpf)   RBC / HPF 0-2  <3 (RBC/hpf)   Bacteria, UA FEW (*) RARE     MAU Course  Procedures NST - category 1 tracing  MDM No evidence of preeclampsia.  Normal neuro exam - likely pinched nerve.  Assessment and Plan  1.  IUP 35.3 weeks 2.  Pinched nerve  Will send patient home.  Patient to follow up with provider tomorrow.  Discussed symptomatic treatment.  Kruti Horacek JEHIEL 08/04/2011, 8:33 PM

## 2011-08-04 NOTE — Discharge Instructions (Signed)
Pinched Nerve The term pinched nerve describes one type of damage or injury to a nerve or set of nerves. Pinched nerves can sometimes lead to other conditions. These include peripheral neuropathy, carpal tunnel syndrome, and tennis elbow. The extent of such injuries may vary from minor, temporary damage to a more permanent condition. Early diagnosis is important to prevent further damage or complications. Pinched nerve is a common cause of on-the-job injury. CAUSES  The injury may result from:  Compression.   Constriction.   Stretching.  SYMPTOMS  Symptoms include:  Numbness.   "Pins and needles" or burning sensations.   Pain radiating outward from the injured area.   One of the most common examples of a single compressed nerve is the feeling of having a foot or hand "fall asleep."  TREATMENT  The most often recommended treatment for pinched nerve is rest for the affected area. Corticosteroids help alleviate pain. In some cases, surgery is recommended. Physical therapy may be recommended. Splints or collars may be used. With treatment, most people recover from pinched nerve. In some cases, the damage is irreversible. Document Released: 03/12/2002 Document Revised: 03/11/2011 Document Reviewed: 02/28/2008 [facility] Patient Information 2012 [place], [place].

## 2011-08-04 NOTE — MAU Note (Signed)
Dr. Adrian Blackwater at bedside at present. Pt here for dizziness and blurred vision beginning at 1600, vision normal at present, still feeling dizzy. Denies bleeding or lof. Decreased fm per pt. +fetal movement in MAU room.
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
## 2011-08-30 ENCOUNTER — Inpatient Hospital Stay (HOSPITAL_COMMUNITY)
Admission: AD | Admit: 2011-08-30 | Discharge: 2011-08-31 | Disposition: A | Discharge status: Home / Self Care | Payer: Medicaid Other | Source: Ambulatory Visit | Attending: Obstetrics & Gynecology | Admitting: Obstetrics & Gynecology

## 2011-08-30 ENCOUNTER — Encounter (HOSPITAL_COMMUNITY): Payer: Self-pay | Admitting: *Deleted

## 2011-08-30 DIAGNOSIS — O479 False labor, unspecified: Secondary | ICD-10-CM | POA: Insufficient documentation

## 2011-08-30 NOTE — MAU Note (Signed)
Contractions all day, denies bleeding or ROM 

## 2011-08-31 MED ORDER — ZOLPIDEM TARTRATE 10 MG PO TABS
10.0000 mg | ORAL_TABLET | Freq: Once | ORAL | Status: AC
  Administered 2011-08-31: 10 mg via ORAL
  Filled 2011-08-31: qty 1

## 2011-08-31 NOTE — MAU Note (Signed)
Dr. Despina Hidden notified of no cervical change after walking.  Orders to dc home after nst.

## 2011-08-31 NOTE — MAU Provider Note (Signed)
  History     CSN: [id number]  Arrival date and time: 08/30/11 2317   First Provider Initiated Contact with Patient 08/31/11 0001      Chief Complaint  Patient presents with  . Labor Eval   HPI This is a 26 y.o. female at [redacted]w[redacted]d who presented for labor evaluation.She was seen by Dr Despina Hidden and cervix examined. She denies leaking or bleeding and reports + FM    OB History    Grav Para Term Preterm Abortions TAB SAB Ect Mult Living   2 1  1      1       Past Medical History  Diagnosis Date  . Depression   . Pregnancy induced hypertension   . Preterm labor   . Headache     Past Surgical History  Procedure Date  . Cesarean section     Family History  Problem Relation Age of Onset  . Anesthesia problems Neg Hx   . Hypotension Neg Hx   . Malignant hyperthermia Neg Hx   . Pseudochol deficiency Neg Hx     History  Substance Use Topics  . Smoking status: Current Everyday Smoker -- 0.5 packs/day    Types: Cigarettes  . Smokeless tobacco: Never Used  . Alcohol Use: No    Allergies: No Known Allergies  Prescriptions prior to admission  Medication Sig Dispense Refill  . acetaminophen (TYLENOL) 325 MG tablet Take 650 mg by mouth every 6 (six) hours as needed. Head, stomach ache      . Prenatal Vit-Fe Fumarate-FA (PRENATAL MULTIVITAMIN) TABS Take 1 tablet by mouth daily.      Marland Kitchen zolpidem (AMBIEN) 10 MG tablet Take 1 tablet (10 mg total) by mouth at bedtime as needed for sleep.  5 tablet  0    ROS As listed in HPI  Physical Exam   Blood pressure 134/83, pulse 98, temperature 97.6 F (36.4 C), temperature source Oral, resp. rate 20, height 5\' 3"  (1.6 m), weight 156 lb (70.761 kg), last menstrual period 08/18/2010, SpO2 100.00%.  Physical Exam  Constitutional: She is oriented to person, place, and time. She appears well-developed and well-nourished.  Cardiovascular: Normal rate.   Respiratory: Effort normal.  GI: Soft. There is no tenderness.  Genitourinary: Uterus  normal. Vaginal discharge found.  Musculoskeletal: Normal range of motion.  Neurological: She is alert and oriented to person, place, and time.  Skin: Skin is warm and dry.  Psychiatric: She has a normal mood and affect.  FHR reassuring UCs irregular Cervix:  Dilation: 3 Effacement (%): 70 Cervical Position: Middle Station: -2 Presentation: Vertex Exam by:: Humphrey Rolls, RN   MAU Course  Procedures  MDM Per Dr Despina Hidden, will have pt walk for an hour and recheck Dilation: 3 Effacement (%): 70 Cervical Position: Middle Station: -2 Presentation: Vertex Exam by:: Humphrey Rolls, RN   Assessment and Plan  A:  [facility] at [redacted]w[redacted]d      Prodromal vs Latent labor P:  Discharged home per Dr Despina Hidden      Return with increased labor, ROM, or bleeding.   Wynelle Bourgeois 08/31/2011, 12:02 AM

## 2011-09-01 NOTE — Discharge Instructions (Signed)

## 2011-09-02 ENCOUNTER — Inpatient Hospital Stay (HOSPITAL_COMMUNITY): Payer: Medicaid Other | Admitting: Anesthesiology

## 2011-09-02 ENCOUNTER — Inpatient Hospital Stay (HOSPITAL_COMMUNITY)
Admission: AD | Admit: 2011-09-02 | Discharge: 2011-09-04 | DRG: 774 | Disposition: A | Discharge status: Home / Self Care | Payer: Medicaid Other | Source: Ambulatory Visit | Attending: Obstetrics & Gynecology | Admitting: Obstetrics & Gynecology

## 2011-09-02 ENCOUNTER — Encounter (HOSPITAL_COMMUNITY): Payer: Self-pay

## 2011-09-02 ENCOUNTER — Encounter (HOSPITAL_COMMUNITY): Payer: Self-pay | Admitting: Anesthesiology

## 2011-09-02 ENCOUNTER — Encounter (HOSPITAL_COMMUNITY): Payer: Self-pay | Admitting: *Deleted

## 2011-09-02 DIAGNOSIS — Z22330 Carrier of Group B streptococcus: Secondary | ICD-10-CM

## 2011-09-02 DIAGNOSIS — Z331 Pregnant state, incidental: Secondary | ICD-10-CM

## 2011-09-02 DIAGNOSIS — O99892 Other specified diseases and conditions complicating childbirth: Secondary | ICD-10-CM | POA: Diagnosis present

## 2011-09-02 HISTORY — DX: Unspecified convulsions: R56.9

## 2011-09-02 HISTORY — DX: Conversion disorder with seizures or convulsions: F44.5

## 2011-09-02 LAB — CBC
HCT: 40.4 % (ref 36.0–46.0)
Hemoglobin: 13.8 g/dL (ref 12.0–15.0)
MCH: 33.2 pg (ref 26.0–34.0)
MCHC: 34.2 g/dL (ref 30.0–36.0)

## 2011-09-02 MED ORDER — EPHEDRINE 5 MG/ML INJ: 10.0000 mg | INTRAVENOUS | Status: DC | PRN

## 2011-09-02 MED ORDER — FLEET ENEMA 7-19 GM/118ML RE ENEM: 1.0000 | ENEMA | RECTAL | Status: DC | PRN

## 2011-09-02 MED ORDER — HYDROXYZINE HCL 50 MG PO TABS
50.0000 mg | ORAL_TABLET | ORAL | Status: AC
  Administered 2011-09-02: 50 mg via ORAL
  Filled 2011-09-02: qty 1

## 2011-09-02 MED ORDER — LACTATED RINGERS IV SOLN
INTRAVENOUS | Status: DC
  Administered 2011-09-02: 11:00:00 via INTRAVENOUS

## 2011-09-02 MED ORDER — LIDOCAINE HCL (PF) 1 % IJ SOLN
INTRAMUSCULAR | Status: DC | PRN
  Administered 2011-09-02 (×2): 4 mL

## 2011-09-02 MED ORDER — HYDROXYZINE HCL 50 MG/ML IM SOLN: 50.0000 mg | Freq: Three times a day (TID) | INTRAMUSCULAR | Status: DC | PRN

## 2011-09-02 MED ORDER — PENICILLIN G POTASSIUM 5000000 UNITS IJ SOLR
2.5000 10*6.[IU] | INTRAVENOUS | Status: DC
  Administered 2011-09-02 – 2011-09-03 (×5): 2.5 10*6.[IU] via INTRAVENOUS
  Filled 2011-09-02 (×10): qty 2.5

## 2011-09-02 MED ORDER — ACETAMINOPHEN 325 MG PO TABS: 650.0000 mg | ORAL_TABLET | ORAL | Status: DC | PRN

## 2011-09-02 MED ORDER — DIPHENHYDRAMINE HCL 50 MG/ML IJ SOLN
12.5000 mg | INTRAMUSCULAR | Status: DC | PRN
  Administered 2011-09-02: 12.5 mg via INTRAVENOUS
  Filled 2011-09-02: qty 1

## 2011-09-02 MED ORDER — NALBUPHINE SYRINGE 5 MG/0.5 ML
10.0000 mg | INJECTION | INTRAMUSCULAR | Status: DC | PRN
  Administered 2011-09-02: 10 mg via INTRAVENOUS

## 2011-09-02 MED ORDER — PHENYLEPHRINE 40 MCG/ML (10ML) SYRINGE FOR IV PUSH (FOR BLOOD PRESSURE SUPPORT): 80.0000 ug | PREFILLED_SYRINGE | INTRAVENOUS | Status: DC | PRN

## 2011-09-02 MED ORDER — CITRIC ACID-SODIUM CITRATE 334-500 MG/5ML PO SOLN
30.0000 mL | ORAL | Status: DC | PRN
  Administered 2011-09-02: 30 mL via ORAL
  Filled 2011-09-02: qty 15

## 2011-09-02 MED ORDER — FENTANYL 2.5 MCG/ML BUPIVACAINE 1/10 % EPIDURAL INFUSION (WH - ANES)
INTRAMUSCULAR | Status: DC | PRN
  Administered 2011-09-02: 14 mL/h via EPIDURAL

## 2011-09-02 MED ORDER — NALBUPHINE SYRINGE 5 MG/0.5 ML
5.0000 mg | INJECTION | INTRAMUSCULAR | Status: DC | PRN
  Administered 2011-09-02: 10 mg via INTRAVENOUS
  Filled 2011-09-02 (×2): qty 0.5
  Filled 2011-09-02 (×2): qty 1

## 2011-09-02 MED ORDER — PHENYLEPHRINE 40 MCG/ML (10ML) SYRINGE FOR IV PUSH (FOR BLOOD PRESSURE SUPPORT)
80.0000 ug | PREFILLED_SYRINGE | INTRAVENOUS | Status: DC | PRN
  Filled 2011-09-02: qty 5

## 2011-09-02 MED ORDER — OXYCODONE-ACETAMINOPHEN 5-325 MG PO TABS
1.0000 | ORAL_TABLET | ORAL | Status: DC | PRN
  Administered 2011-09-03: 2 via ORAL
  Filled 2011-09-02: qty 2

## 2011-09-02 MED ORDER — FENTANYL 2.5 MCG/ML BUPIVACAINE 1/10 % EPIDURAL INFUSION (WH - ANES)
14.0000 mL/h | INTRAMUSCULAR | Status: DC
  Administered 2011-09-02 – 2011-09-03 (×6): 14 mL/h via EPIDURAL
  Filled 2011-09-02 (×8): qty 60

## 2011-09-02 MED ORDER — LIDOCAINE HCL (PF) 1 % IJ SOLN
30.0000 mL | INTRAMUSCULAR | Status: DC | PRN
  Administered 2011-09-03: 30 mL via SUBCUTANEOUS
  Filled 2011-09-02: qty 30

## 2011-09-02 MED ORDER — OXYTOCIN BOLUS FROM INFUSION
500.0000 mL | Freq: Once | INTRAVENOUS | Status: DC
  Filled 2011-09-02: qty 500

## 2011-09-02 MED ORDER — TERBUTALINE SULFATE 1 MG/ML IJ SOLN: 0.2500 mg | Freq: Once | INTRAMUSCULAR | Status: AC | PRN

## 2011-09-02 MED ORDER — LACTATED RINGERS IV SOLN
500.0000 mL | INTRAVENOUS | Status: DC | PRN
  Administered 2011-09-03: 1000 mL via INTRAVENOUS

## 2011-09-02 MED ORDER — LACTATED RINGERS IV SOLN: 500.0000 mL | Freq: Once | INTRAVENOUS | Status: DC

## 2011-09-02 MED ORDER — PENICILLIN G POTASSIUM 5000000 UNITS IJ SOLR
5.0000 10*6.[IU] | Freq: Once | INTRAVENOUS | Status: AC
  Administered 2011-09-02: 5 10*6.[IU] via INTRAVENOUS
  Filled 2011-09-02: qty 5

## 2011-09-02 MED ORDER — OXYTOCIN 20 UNITS IN LACTATED RINGERS INFUSION - SIMPLE
1.0000 m[IU]/min | INTRAVENOUS | Status: DC
  Administered 2011-09-02: 1 m[IU]/min via INTRAVENOUS

## 2011-09-02 MED ORDER — IBUPROFEN 600 MG PO TABS
600.0000 mg | ORAL_TABLET | Freq: Four times a day (QID) | ORAL | Status: DC | PRN
  Administered 2011-09-03: 600 mg via ORAL
  Filled 2011-09-02: qty 1

## 2011-09-02 MED ORDER — ONDANSETRON HCL 4 MG/2ML IJ SOLN
4.0000 mg | Freq: Four times a day (QID) | INTRAMUSCULAR | Status: DC | PRN
  Administered 2011-09-02 – 2011-09-03 (×2): 4 mg via INTRAVENOUS
  Filled 2011-09-02 (×3): qty 2

## 2011-09-02 MED ORDER — EPHEDRINE 5 MG/ML INJ
10.0000 mg | INTRAVENOUS | Status: DC | PRN
  Filled 2011-09-02: qty 4

## 2011-09-02 MED ORDER — OXYTOCIN 20 UNITS IN LACTATED RINGERS INFUSION - SIMPLE
125.0000 mL/h | Freq: Once | INTRAVENOUS | Status: AC
  Administered 2011-09-03: 125 mL/h via INTRAVENOUS
  Filled 2011-09-02: qty 1000

## 2011-09-02 NOTE — MAU Note (Signed)
Onset 0545, VBAC previous C/S for previa, no bleeding today, no problems this pregnancy

## 2011-09-02 NOTE — H&P (Signed)
Gabriela Taylor is a 26 y.o. female presenting for Contractions and Labor Evaluation. Maternal Medical History:  Reason for admission: Reason for admission: contractions.  Contractions: Onset was 6-12 hours ago.   Frequency: regular.   Perceived severity is strong.    Fetal activity: Perceived fetal activity is normal.   Last perceived fetal movement was within the past hour.    Prenatal complications: No bleeding, cholelithiasis, HIV, hypertension, infection, IUGR, nephrolithiasis, oligohydramnios, placental abnormality, polyhydramnios, pre-eclampsia, preterm labor, substance abuse, thrombocytopenia or thrombophilia.   Prenatal Complications - Diabetes: none.   [facility] for Placenta Previa - Planning TOLAC Rh Negative - Rho-Gam on 06/15/11 PAP - LSIC +THC on initial UDS Genetic Screen  sequential negative  Anatomic [place]  normal  Glucose Screen  2hr - 16,10,96  GC / Chlamydia  neg  GBS   POSITIVE  Feeding Preference  Bottle  Contraception  Mirena    OB History    Grav Para Term Preterm Abortions TAB SAB Ect Mult Living   2 1  1      1      Past Medical History  Diagnosis Date  . Depression   . Pregnancy induced hypertension   . Preterm labor   . Headache   . Pseudoseizures    Past Surgical History  Procedure Date  . Cesarean section    Family History: family history is negative for Anesthesia problems, and Hypotension, and Malignant hyperthermia, and Pseudochol deficiency, . Social History:  reports that she has been smoking Cigarettes.  She has been smoking about .5 packs per day. She has never used smokeless tobacco. 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 She reports that she does not drink alcohol.  Review of Systems  Constitutional: Negative for fever and chills.  HENT: Negative.   Eyes: Negative for blurred vision and double vision.  Respiratory: Negative.   Cardiovascular: Positive for leg swelling.  Gastrointestinal: Negative.     Genitourinary: Negative.   Musculoskeletal: Negative.   Skin: Negative.   Neurological: Negative.   Endo/Heme/Allergies: Negative.   Psychiatric/Behavioral: Negative.     Dilation: 6 Effacement (%): 70 Station: 0 Exam by:: dr Berline Chough Blood pressure 136/91, pulse 78, temperature 97 F (36.1 C), resp. rate 20, last menstrual period 08/18/2010. Maternal Exam:  Uterine Assessment: Contraction strength is firm.  Contraction frequency is regular.   Abdomen: Surgical scars: low transverse.   Fundal height is appropriate for gestational age.   Fetal presentation: vertex  Introitus: Normal vulva. Normal vagina.  Pelvis: adequate for delivery.   Cervix: Cervix evaluated by digital exam.     Fetal Exam Fetal Monitor Review: Baseline rate: 140.  Variability: moderate (6-25 bpm).   Pattern: accelerations present and no decelerations.    Fetal State Assessment: Category I - tracings are normal.     Physical Exam  Constitutional: She is oriented to person, place, and time. She appears well-developed and well-nourished. No distress.  HENT:  Head: Normocephalic and atraumatic.  Eyes: Conjunctivae are normal. Right eye exhibits no discharge. Left eye exhibits no discharge.  Neck: Neck supple. Thyromegaly present.  Cardiovascular: Normal rate, regular rhythm, normal heart sounds and intact distal pulses.  Exam reveals no gallop and no friction rub.   No murmur heard. Respiratory: Effort normal and breath sounds normal. No respiratory distress. She has no wheezes. She has no rales.  GI: Soft. She exhibits distension and mass. There is no tenderness. There is no rebound and no guarding.  Genitourinary: Vagina  normal and uterus normal.  Musculoskeletal: She exhibits no edema and no tenderness.  Neurological: She is alert and oriented to person, place, and time. She exhibits normal muscle tone.  Skin: Skin is warm and dry. No rash noted. She is not diaphoretic. No erythema. No pallor.   Psychiatric: She has a normal mood and affect. Her behavior is normal. Judgment normal.    Prenatal labs: ABO, Rh: B/Negative/-- (10/10 0000) Antibody: Negative (10/10 0000) Rubella: Immune (10/10 0000) RPR:   Negative HBsAg: Negative (10/10 0000)  HIV: Non-reactive (10/10 0000)  GBS: Positive (05/16 0000)   Assessment/Plan: Pt in active labor with contractions q75minutes and cervical changes.   Pt with Elevated BP.  Will continue to monitor closely To L&D for TOLAC.  Epidural if requested Repeat UDS Penicillin Anticipate NSVD  Andrena Mews, DO [facility] Resident - PGY-1  09/02/2011 10:46 AM

## 2011-09-02 NOTE — MAU Provider Note (Signed)
Agree with above note.  Gabriela Taylor H. 09/02/2011 8:35 PM

## 2011-09-02 NOTE — Anesthesia Preprocedure Evaluation (Signed)
Anesthesia Evaluation  Patient identified by MRN, date of birth, ID band Patient awake    Reviewed: Allergy & Precautions, H&P , Patient's Chart, lab work & pertinent test results  Airway Mallampati: III TM Distance: >3 FB Neck ROM: full    Dental No notable dental hx. (+) Teeth Intact   Pulmonary neg pulmonary ROS,  breath sounds clear to auscultation  Pulmonary exam normal       Cardiovascular hypertension, negative cardio ROS  Rhythm:regular Rate:Normal     Neuro/Psych negative neurological ROS  negative psych ROS   GI/Hepatic negative GI ROS, Neg liver ROS,   Endo/Other  negative endocrine ROS  Renal/GU negative Renal ROS  negative genitourinary   Musculoskeletal   Abdominal Normal abdominal exam  (+)   Peds  Hematology negative hematology ROS (+)   Anesthesia Other Findings   Reproductive/Obstetrics (+) Pregnancy                           Anesthesia Physical Anesthesia Plan  ASA: II  Anesthesia Plan: Epidural   Post-op Pain Management:    Induction:   Airway Management Planned:   Additional Equipment:   Intra-op Plan:   Post-operative Plan:   Informed Consent: I have reviewed the patients History and Physical, chart, labs and discussed the procedure including the risks, benefits and alternatives for the proposed anesthesia with the patient or authorized representative who has indicated his/her understanding and acceptance.     Plan Discussed with: Anesthesiologist, CRNA and Surgeon  Anesthesia Plan Comments:         Anesthesia Quick Evaluation

## 2011-09-02 NOTE — Anesthesia Procedure Notes (Signed)
Epidural Patient location during procedure: OB Start time: 09/02/2011 11:19 AM  Staffing Anesthesiologist: Kaidyn Javid A. Performed by: anesthesiologist   Preanesthetic Checklist Completed: patient identified, site marked, surgical consent, pre-op evaluation, timeout performed, IV checked, risks and benefits discussed and monitors and equipment checked  Epidural Patient position: sitting Prep: site prepped and draped and DuraPrep Patient monitoring: continuous pulse ox and blood pressure Approach: midline Injection technique: LOR air  Needle:  Needle type: Tuohy  Needle gauge: 17 G Needle length: 9 cm Needle insertion depth: 5 cm cm Catheter type: closed end flexible Catheter size: 19 Gauge Catheter at skin depth: 10 cm Test dose: negative and Other  Assessment Events: blood not aspirated, injection not painful, no injection resistance, negative IV test and no paresthesia  Additional Notes Patient identified. Risks and benefits discussed including failed block, incomplete  Pain control, post dural puncture headache, nerve damage, paralysis, blood pressure Changes, nausea, vomiting, reactions to medications-both toxic and allergic and post Partum back pain. All questions were answered. Patient expressed understanding and wished to proceed. Sterile technique was used throughout procedure. Epidural site was Dressed with sterile barrier dressing. No paresthesias, signs of intravascular injection Or signs of intrathecal spread were encountered.  Patient was more comfortable after the epidural was dosed. Please see RN's note for documentation of vital signs and FHR which are stable.

## 2011-09-02 NOTE — Progress Notes (Signed)
KESLYN TEATER is a 26 y.o. G2P0101 at [redacted]w[redacted]d admitted for active labor  Subjective:   Objective: BP 130/81  Pulse 63  Temp 98.5 F (36.9 C)  Resp 18  Ht 5\' 3"  (1.6 m)  Wt 71.215 kg (157 lb)  BMI 27.81 kg/m2  SpO2 100%  LMP 08/18/2010   Total I/O In: -  Out: 700 [Urine:700]  FHT:  FHR: 125 bpm, variability: moderate,  accelerations:  Present,  decelerations:  Absent UC:   irregular, every 10 minutes SVE:   4-5/70/-2  Labs: Lab Results  Component Value Date   WBC 11.4* 09/02/2011   HGB 13.8 09/02/2011   HCT 40.4 09/02/2011   MCV 97.1 09/02/2011   PLT 153 09/02/2011    Assessment / Plan: TOLAC  Labor: Progressing normally Preeclampsia:  n/a Fetal Wellbeing:  Category I Pain Control:  Epidural  Anticipated MOD:  NSVD  Whetstone, Jared 09/02/2011, 1:47 PM  I have seen this patient and agree with the above PA student's note.  LEFTWICH-KIRBY, Sreekar Broyhill Certified Nurse-Midwife

## 2011-09-02 NOTE — MAU Provider Note (Signed)
Pt presented with report of intense contractions 5 minutes apart.  No loss of fluids, continues to have leaking of clear fluid that has been present for >1 week.  Seen 3 days previously and was 3cm dilated.  Cervical exam today: Dilation: 4 Effacement (%): 50 Cervical Position: Posterior Station: -1 Presentation: Vertex Exam by:: Dr Berline Chough   Will plan to continue monitor and observe contraction pattern.  If persistent will plan for admission.     Andrena Mews, DO [facility] Resident - PGY-1 09/02/2011 9:07 AM

## 2011-09-02 NOTE — Progress Notes (Addendum)
Gabriela Taylor is a 26 y.o. G2P0101 at [redacted]w[redacted]d admitted for active labor  Subjective: Pt anxious, reports general discomfort but denies pain with epidural.    Objective: BP 151/90  Pulse 54  Temp(Src) 97.9 F (36.6 C) (Oral)  Resp 18  Ht 5\' 3"  (1.6 m)  Wt 71.215 kg (157 lb)  BMI 27.81 kg/m2  SpO2 100%  LMP 08/18/2010 I/O last 3 completed shifts: In: -  Out: 1750 [Urine:1750]    FHT:  FHR: 130 bpm, variability: moderate,  accelerations:  Present,  decelerations:  Absent UC:   Regular, Q 3-4 min SVE:   Dilation: 5 Effacement (%): 90 Station: -2 Exam by:: Leftwich-Kirby, CNM   Labs: Lab Results  Component Value Date   WBC 11.4* 09/02/2011   HGB 13.8 09/02/2011   HCT 40.4 09/02/2011   MCV 97.1 09/02/2011   PLT 153 09/02/2011    Assessment / Plan: Cervix unchanged for several hours following admission and pt having minimal contractions following epidural.  Discussed findings with Dr Penne Lash.  Pt given opportunity to remove epidural, and be discharged and return when in active labor.  Increased risks of failed TOLAC and uterine rupture with Pitocin presented to pt.  Pt states understanding and desires to induce/augment labor with Pitocin.  Pitocin started and pt contractions regular at this time.  Augmentation of labor, progressing well  Labor: Progressing on Pitocin, will continue to increase then AROM Preeclampsia:  N/a Fetal Wellbeing:  Category I Pain Control:  Epidural I/D:  n/a Anticipated MOD:  VBAC  LEFTWICH-KIRBY, Jezabelle Chisolm 09/02/2011, 11:06 PM

## 2011-09-02 NOTE — H&P (Signed)
RN and Midwife state pt's exam is 4 cm.  Pt notified of difference in exam.  Pt not wanting to go home and would like augmentation.  Pt signed VBAC consent and aware of risk of uterine rupture and that this risk increases with the administration of pitocin.

## 2011-09-03 ENCOUNTER — Encounter (HOSPITAL_COMMUNITY): Payer: Self-pay | Admitting: *Deleted

## 2011-09-03 DIAGNOSIS — O9989 Other specified diseases and conditions complicating pregnancy, childbirth and the puerperium: Secondary | ICD-10-CM

## 2011-09-03 MED ORDER — MISOPROSTOL 200 MCG PO TABS
ORAL_TABLET | ORAL | Status: AC
  Administered 2011-09-03: 1000 ug
  Filled 2011-09-03: qty 5

## 2011-09-03 MED ORDER — LANOLIN HYDROUS EX OINT: TOPICAL_OINTMENT | CUTANEOUS | Status: DC | PRN

## 2011-09-03 MED ORDER — ONDANSETRON HCL 4 MG/2ML IJ SOLN: 4.0000 mg | INTRAMUSCULAR | Status: DC | PRN

## 2011-09-03 MED ORDER — OXYTOCIN 10 UNIT/ML IJ SOLN
INTRAMUSCULAR | Status: AC
  Filled 2011-09-03: qty 2

## 2011-09-03 MED ORDER — DIPHENHYDRAMINE HCL 25 MG PO CAPS: 25.0000 mg | ORAL_CAPSULE | Freq: Four times a day (QID) | ORAL | Status: DC | PRN

## 2011-09-03 MED ORDER — OXYCODONE-ACETAMINOPHEN 5-325 MG PO TABS
1.0000 | ORAL_TABLET | ORAL | Status: DC | PRN
  Administered 2011-09-03: 2 via ORAL
  Administered 2011-09-04: 1 via ORAL
  Administered 2011-09-04: 2 via ORAL
  Administered 2011-09-04: 1 via ORAL
  Administered 2011-09-04: 2 via ORAL
  Filled 2011-09-03: qty 2
  Filled 2011-09-03: qty 1
  Filled 2011-09-03 (×2): qty 2
  Filled 2011-09-03: qty 1
  Filled 2011-09-03: qty 2

## 2011-09-03 MED ORDER — WITCH HAZEL-GLYCERIN EX PADS: 1.0000 "application " | MEDICATED_PAD | CUTANEOUS | Status: DC | PRN

## 2011-09-03 MED ORDER — BENZOCAINE-MENTHOL 20-0.5 % EX AERO
1.0000 "application " | INHALATION_SPRAY | CUTANEOUS | Status: DC | PRN
  Administered 2011-09-03: 1 via TOPICAL
  Filled 2011-09-03: qty 56

## 2011-09-03 MED ORDER — PRENATAL MULTIVITAMIN CH
1.0000 | ORAL_TABLET | Freq: Every day | ORAL | Status: DC
  Administered 2011-09-04: 1 via ORAL
  Filled 2011-09-03: qty 1

## 2011-09-03 MED ORDER — DIBUCAINE 1 % RE OINT: 1.0000 "application " | TOPICAL_OINTMENT | RECTAL | Status: DC | PRN

## 2011-09-03 MED ORDER — ONDANSETRON HCL 4 MG PO TABS: 4.0000 mg | ORAL_TABLET | ORAL | Status: DC | PRN

## 2011-09-03 MED ORDER — IBUPROFEN 600 MG PO TABS
600.0000 mg | ORAL_TABLET | Freq: Four times a day (QID) | ORAL | Status: DC
  Administered 2011-09-04 (×4): 600 mg via ORAL
  Filled 2011-09-03 (×5): qty 1

## 2011-09-03 MED ORDER — SENNOSIDES-DOCUSATE SODIUM 8.6-50 MG PO TABS
2.0000 | ORAL_TABLET | Freq: Every day | ORAL | Status: DC
  Administered 2011-09-03: 2 via ORAL

## 2011-09-03 MED ORDER — ZOLPIDEM TARTRATE 5 MG PO TABS
5.0000 mg | ORAL_TABLET | Freq: Every evening | ORAL | Status: DC | PRN
  Administered 2011-09-04: 5 mg via ORAL
  Filled 2011-09-03: qty 1

## 2011-09-03 MED ORDER — TETANUS-DIPHTH-ACELL PERTUSSIS 5-2.5-18.5 LF-MCG/0.5 IM SUSP
0.5000 mL | Freq: Once | INTRAMUSCULAR | Status: DC
  Filled 2011-09-03: qty 0.5

## 2011-09-03 MED ORDER — SIMETHICONE 80 MG PO CHEW: 80.0000 mg | CHEWABLE_TABLET | ORAL | Status: DC | PRN

## 2011-09-03 NOTE — Progress Notes (Signed)
Gabriela Taylor is a 26 y.o. G2P0101 at [redacted]w[redacted]d  Subjective:  Pt called out to report feeling vaginal pressure.  Objective: BP 138/92  Pulse 57  Temp(Src) 98.2 F (36.8 C) (Oral)  Resp 18  Ht 5\' 3"  (1.6 m)  Wt 71.215 kg (157 lb)  BMI 27.81 kg/m2  SpO2 100%  LMP 08/18/2010 I/O last 3 completed shifts: In: -  Out: 1750 [Urine:1750]    FHT:  FHR: 125 bpm, variability: moderate,  accelerations:  Present,  decelerations:  Absent UC:   regular, every 3 minutes SVE:   Dilation: 6 Effacement (%): 90 Station: -2 Exam by:: Leftwich-Kirby, CNM   Cervix unchanged IUPC adjusted during cervical exam  Labs: Lab Results  Component Value Date   WBC 11.4* 09/02/2011   HGB 13.8 09/02/2011   HCT 40.4 09/02/2011   MCV 97.1 09/02/2011   PLT 153 09/02/2011    Assessment / Plan: Augmentation of labor, progressing well  Labor: Progressing well, plan to increase Pitocin Preeclampsia:  N/a Fetal Wellbeing:  Category I Pain Control:  Epidural I/D:  n/a Anticipated MOD:  VBAC  LEFTWICH-KIRBY, Juana Montini 09/03/2011, 2:39 AM

## 2011-09-03 NOTE — Progress Notes (Signed)
Gabriela Taylor is a 26 y.o. G2P0101 at [redacted]w[redacted]d  Subjective: Feeling pressure  Objective: BP 129/104  Pulse 176  Temp(Src) 97.9 F (36.6 C) (Axillary)  Resp 18  Ht 5\' 3"  (1.6 m)  Wt 71.215 kg (157 lb)  BMI 27.81 kg/m2  SpO2 100%  LMP 08/18/2010 I/O last 3 completed shifts: In: -  Out: 1750 [Urine:1750]    FHT:  130, moderate variability, accelerations present, no decels UC:   regular, every 3 minutes SVE:   Dilation: 10 Effacement (%): 100 Station: +1 Exam by:: stinson  Labs: Lab Results  Component Value Date   WBC 11.4* 09/02/2011   HGB 13.8 09/02/2011   HCT 40.4 09/02/2011   MCV 97.1 09/02/2011   PLT 153 09/02/2011    Assessment / Plan: Progressing well.  Second stage  Labor: Progressing normally and on pitocin Preeclampsia:  n/a Fetal Wellbeing:  Category I Pain Control:  Epidural I/D:  n/a Anticipated MOD:  NSVD  Andrena Mews, DO [facility] Resident - PGY-1 09/03/2011 12:58 PM

## 2011-09-03 NOTE — Progress Notes (Signed)
Gabriela Taylor is a 26 y.o. G2P0101 at [redacted]w[redacted]d admitted for labor desiring TOLAC.    Subjective: Pt comfortable with epidural.  Reports some leakage of fluid.  Objective: BP 117/61  Pulse 71  Temp(Src) 98.2 F (36.8 C) (Oral)  Resp 18  Ht 5\' 3"  (1.6 m)  Wt 71.215 kg (157 lb)  BMI 27.81 kg/m2  SpO2 100%  LMP 08/18/2010 I/O last 3 completed shifts: In: -  Out: 1750 [Urine:1750]    FHT:  FHR: 130 bpm, variability: moderate,  accelerations:  Present,  decelerations:  Absent UC:   regular, every 3 minutes SVE:   6/90/-2  Bag of membranes not palpable on cervical exam but scant clear fluid leakage noted on pad and on glove  IUPC placed without difficulty.  Pt tolerated well.  Clear amniotic fluid noted.  Labs: Lab Results  Component Value Date   WBC 11.4* 09/02/2011   HGB 13.8 09/02/2011   HCT 40.4 09/02/2011   MCV 97.1 09/02/2011   PLT 153 09/02/2011    Assessment / Plan: Augmentation of labor, progressing well  Labor: Adequate labor progress on Pitocin Preeclampsia:  N/A Fetal Wellbeing:  Category I Pain Control:  Epidural I/D:  n/a Anticipated MOD:  VBAC  Taylor, Gabriela Lolli 09/03/2011, 12:47 AM

## 2011-09-04 LAB — CBC
MCH: 33.2 pg (ref 26.0–34.0)
MCHC: 34.4 g/dL (ref 30.0–36.0)
MCV: 96.5 fL (ref 78.0–100.0)
Platelets: 149 10*3/uL — ABNORMAL LOW (ref 150–400)
RDW: 12.6 % (ref 11.5–15.5)

## 2011-09-04 MED ORDER — TETANUS-DIPHTH-ACELL PERTUSSIS 5-2.5-18.5 LF-MCG/0.5 IM SUSP: 0.5000 mL | Freq: Once | INTRAMUSCULAR | Status: DC

## 2011-09-04 MED ORDER — IBUPROFEN 600 MG PO TABS: 600.0000 mg | ORAL_TABLET | Freq: Four times a day (QID) | ORAL | Status: AC

## 2011-09-04 MED ORDER — OXYCODONE-ACETAMINOPHEN 5-325 MG PO TABS: 1.0000 | ORAL_TABLET | ORAL | Status: AC | PRN

## 2011-09-04 MED ORDER — SENNOSIDES-DOCUSATE SODIUM 8.6-50 MG PO TABS: 2.0000 | ORAL_TABLET | Freq: Every day | ORAL | Status: AC

## 2011-09-04 NOTE — Progress Notes (Signed)
Change of shift , nurses were walking by patient room, overheard foul language, very loud, to someone she was talking to  On the phone.  Nursery notified, waiting d/c pending CPS visit and MD order.

## 2011-09-04 NOTE — Discharge Summary (Signed)
Obstetric Discharge Summary Reason for Admission: onset of labor Prenatal Procedures: none Intrapartum Procedures: spontaneous vaginal delivery Postpartum Procedures: none Complications-Operative and Postpartum: periurethral laceration Hemoglobin  Date Value Range Status  09/04/2011 9.5* 12.0-15.0 (g/dL) Final     DELTA CHECK NOTED     REPEATED TO VERIFY     HCT  Date Value Range Status  09/04/2011 27.6* 36.0-46.0 (%) Final    Physical Exam:  General: alert, cooperative and no distress Lochia: appropriate Uterine Fundus: firm DVT Evaluation: No evidence of DVT seen on physical exam. Negative Homan's sign. No cords or calf tenderness. No significant calf/ankle edema.  Discharge Diagnoses: Term Pregnancy-delivered  Discharge Information: Date: 09/04/2011 Activity: pelvic rest Diet: routine Medications: PNV, Ibuprofen, Colace and Percocet Condition: stable Instructions: refer to practice specific booklet Discharge to: home Follow-up Information    Follow up with [facility] in 6 weeks.         Newborn Data: Live born female  Birth Weight: 7 lb 2.5 oz (3246 g) APGAR: 6, 9  Home with mother.  Gabriela Taylor JEHIEL 09/04/2011, 6:46 AM

## 2011-09-04 NOTE — Discharge Instructions (Signed)
Vaginal Delivery Care After  Change your pad on each trip to the bathroom.   Wipe gently with toilet paper during your hospital stay. Always wipe from front to back. A spray bottle with warm tap water could also be used or a towelette if available.   Place your soiled pad and toilet paper in a bathroom wastebasket with a plastic bag liner.   During your hospital stay, save any clots. If you pass a clot while on the toilet, do not flush it. Also, if your vaginal flow seems excessive to you, notify nursing personnel.   The first time you get out of bed after delivery, wait for assistance from a nurse. Do not get up alone at any time if you feel weak or dizzy.   Bend and extend your ankles forcefully so that you feel the calves of your legs get hard. Do this 6 times every hour when you are in bed and awake.   Do not sit with one foot under you, dangle your legs over the edge of the bed, or maintain a position that hinders the circulation in your legs.   Many women experience after pains for 2 to 3 days after delivery. These after pains are mild uterine contractions. Ask the nurse for a pain medication if you need something for this. Sometimes breastfeeding stimulates after pains; if you find this to be true, ask for the medication  -  hour before the next feeding.   For you and your infant's protection, do not go beyond the door(s) of the obstetric unit. Do not carry your baby in your arms in the hallway. When taking your baby to and from your room, put your baby in the bassinet and push the bassinet.   Mothers may have their babies in their room as much as they desire.  Document Released: 03/19/2000 Document Revised: 03/11/2011 Document Reviewed: 02/17/2007 [employer] Patient Information 2012 [employer]. 

## 2011-09-04 NOTE — Clinical Social Work Note (Signed)
SW received call from [facility] CPS worker, Carrie Mew, [phone], after she had completed a home visit with FOB.  CPS reported pt is clear for discharge.  CPS will meet with pt and baby tomorrow at home to conduct further assessment.  She stated chart address is incorrect.  Pt lives at [address], [place], [place], [place] [address].  SW informed Mother/Baby RN, Rowan Blase, and Nursery RN, Joni Reining, of results of CPS visit.  Please contact SW with any additional questions.

## 2011-09-04 NOTE — Clinical Social Work Maternal (Signed)
Clinical Social Work Department PSYCHOSOCIAL ASSESSMENT - MATERNAL/CHILD 09/04/2011  Patient:  Gabriela Taylor,Gabriela Taylor  Account Number:  [id number]  Admit Date:  09/02/2011  Childs Name:   Mason Williams    Clinical Social Worker:  Rayjon Wery, LCSW   Date/Time:  09/04/2011 02:35 PM  Date Referred:  09/04/2011   Referral source  [facility]     Referred reason  Substance Abuse  Depression/Anxiety   Other referral source:    I:  FAMILY / HOME ENVIRONMENT Child's legal guardian:  PARENT  Guardian - Name Guardian - Age Guardian - Address  Jory Harron [address], [place], [place] [address]   Other household support members/support persons Name Relationship DOB  Ronald Williams FATHER 05/21/83  Landon Rae New SON 5 y/o   Other support:   Pt's mother lives next door and FOB's family lives five minutes away.    II  PSYCHOSOCIAL DATA Information Source:  Patient Interview  Financial and Community Resources Employment:   Pt is not working outside of the home.   Financial resources:  Medicaid If Medicaid - County:  [place]  School / Grade:   Maternity Care Coordinator / Child Services Coordination / Early Interventions:  Cultural issues impacting care:   None per pt.    III  STRENGTHS Strengths  Adequate Resources  Home prepared for Child (including basic supplies)  Supportive family/friends   Strength comment:  Pt is able to identify her mental health issues.   IV  RISK FACTORS AND CURRENT PROBLEMS Current Problem:  YES   Risk Factor & Current Problem Patient Issue Family Issue Risk Factor / Current Problem Comment  Mental Illness Y N Pt has Bipolar and ADHD  Substance Abuse Y N Pt and baby tested positive for THC    V  SOCIAL WORK ASSESSMENT Received referral due to baby and pt both tested positive for THC.  Mother stated she used marijuana about two months ago.  She reports going off of her medications when she found out she was pregnant.  She  reports taking Ativan TID for anxiety but nothing for the Bipolar.  She showed SW an appointment card for June 10th, 2013 with Dr. Sekli at [facility].  She appeared appropriate with the baby. Her 5 y/o son lives with her.  Pt also has adequate supplies and support.    SW phoned CPS and spoke with Cinthia Moore.  She is meeting FOB at 3pm today at pt's home to determine if it is a safe environment for the baby.  If it is, pt can d/c today and CPS worker will meet with pt at her home tomorrow to conduct further assessment.  Pt is aware of FOB meeting CPS worker today.      VI SOCIAL WORK PLAN Social Work Plan  Child Protective Services Report   Type of pt/family education:   If child protective services report - county:  [place] If child protective services report - date:  09/04/2011 Information/referral to community resources comment:   Other social work plan:      

## 2011-09-06 NOTE — Progress Notes (Signed)
Post discharge chart review completed.  

## 2011-09-08 NOTE — Anesthesia Postprocedure Evaluation (Signed)
  Anesthesia Post-op Note  Patient: Gabriela Taylor  Procedure(s) Performed: * No procedures listed *  Patient Location: PACU and Mother/Baby  Anesthesia Type: Epidural  Level of Consciousness: awake, alert  and oriented  Airway and Oxygen Therapy: Patient Spontanous Breathing  Post-op Pain: none  Post-op Assessment: Post-op Vital signs reviewed, Patient's Cardiovascular Status Stable, Pain level controlled, No headache, No backache, No residual numbness and No residual motor weakness  Post-op Vital Signs: Reviewed and stable  Complications: No apparent anesthesia complications

## 2011-09-08 NOTE — Addendum Note (Signed)
Addendum  created 09/08/11 1643 by Tyrone Apple. Malen Gauze, MD   Modules edited:Notes Section

## 2011-11-11 ENCOUNTER — Encounter (HOSPITAL_COMMUNITY): Payer: Self-pay | Admitting: *Deleted

## 2011-11-11 ENCOUNTER — Emergency Department (HOSPITAL_COMMUNITY)
Admission: EM | Admit: 2011-11-11 | Discharge: 2011-11-11 | Disposition: A | Discharge status: Home / Self Care | Payer: Medicaid Other | Attending: Emergency Medicine | Admitting: Emergency Medicine

## 2011-11-11 DIAGNOSIS — R110 Nausea: Secondary | ICD-10-CM | POA: Insufficient documentation

## 2011-11-11 DIAGNOSIS — R51 Headache: Secondary | ICD-10-CM | POA: Insufficient documentation

## 2011-11-11 NOTE — ED Notes (Signed)
No answer when called 

## 2011-11-11 NOTE — ED Notes (Signed)
Headache for 2 days. Nausea, no vomiting., No injury to head

## 2012-03-08 ENCOUNTER — Other Ambulatory Visit: Payer: Self-pay | Admitting: Neurosurgery

## 2012-03-08 DIAGNOSIS — M5001 Cervical disc disorder with myelopathy,  high cervical region: Secondary | ICD-10-CM

## 2012-03-11 ENCOUNTER — Other Ambulatory Visit: Payer: Medicaid Other

## 2012-03-15 ENCOUNTER — Inpatient Hospital Stay: Admission: RE | Admit: 2012-03-15 | Payer: Medicaid Other | Source: Ambulatory Visit

## 2012-03-30 ENCOUNTER — Other Ambulatory Visit: Payer: Self-pay | Admitting: Neurosurgery

## 2012-04-13 ENCOUNTER — Other Ambulatory Visit: Payer: Medicaid Other

## 2012-04-16 ENCOUNTER — Ambulatory Visit
Admission: RE | Admit: 2012-04-16 | Discharge: 2012-04-16 | Disposition: A | Discharge status: Home / Self Care | Payer: Medicaid Other | Source: Ambulatory Visit | Attending: Neurosurgery | Admitting: Neurosurgery

## 2012-04-16 DIAGNOSIS — M5001 Cervical disc disorder with myelopathy,  high cervical region: Secondary | ICD-10-CM

## 2012-05-02 ENCOUNTER — Other Ambulatory Visit: Payer: Self-pay | Admitting: Neurosurgery

## 2012-06-21 ENCOUNTER — Other Ambulatory Visit: Payer: Self-pay | Admitting: Obstetrics & Gynecology

## 2012-06-21 MED ORDER — CYCLOBENZAPRINE HCL 10 MG PO TABS: 10.0000 mg | ORAL_TABLET | Freq: Three times a day (TID) | ORAL | Status: DC | PRN

## 2012-07-24 ENCOUNTER — Other Ambulatory Visit: Payer: Self-pay | Admitting: *Deleted

## 2012-07-25 ENCOUNTER — Telehealth: Payer: Self-pay | Admitting: *Deleted

## 2012-07-25 MED ORDER — LORAZEPAM 0.5 MG PO TABS: 0.5000 mg | ORAL_TABLET | Freq: Two times a day (BID) | ORAL | Status: DC | PRN

## 2012-07-25 NOTE — Telephone Encounter (Signed)
Called Lorazepam 0.5 mg  to [facility] in [place] as RX in epic per Dr. Despina Hidden. Pt aware.

## 2012-09-01 ENCOUNTER — Other Ambulatory Visit: Payer: Self-pay | Admitting: *Deleted

## 2012-09-01 ENCOUNTER — Encounter: Payer: Self-pay | Admitting: *Deleted

## 2012-09-01 MED ORDER — CYCLOBENZAPRINE HCL 10 MG PO TABS: 10.0000 mg | ORAL_TABLET | Freq: Three times a day (TID) | ORAL | Status: DC | PRN

## 2012-09-01 NOTE — Telephone Encounter (Signed)
Okay to refill cyclobenzaprine 10 mg per Dr. Despina Hidden.

## 2012-09-25 ENCOUNTER — Other Ambulatory Visit: Payer: Self-pay | Admitting: *Deleted

## 2012-09-26 MED ORDER — LORAZEPAM 0.5 MG PO TABS: 0.5000 mg | ORAL_TABLET | Freq: Two times a day (BID) | ORAL | Status: DC | PRN

## 2012-10-11 ENCOUNTER — Telehealth: Payer: Self-pay | Admitting: Obstetrics & Gynecology

## 2012-10-11 MED ORDER — LORAZEPAM 0.5 MG PO TABS: 0.5000 mg | ORAL_TABLET | Freq: Two times a day (BID) | ORAL | Status: DC | PRN

## 2012-10-11 MED ORDER — CYCLOBENZAPRINE HCL 10 MG PO TABS: 10.0000 mg | ORAL_TABLET | Freq: Three times a day (TID) | ORAL | Status: DC | PRN

## 2012-10-11 NOTE — Addendum Note (Signed)
Addended by: Criss Alvine on: 10/11/2012 12:22 PM   Modules accepted: Orders

## 2012-10-11 NOTE — Telephone Encounter (Signed)
Pt informed per Dr. Despina Hidden would no longer be filling the flexeril and Ativan for chronic back pain and anxiety. Pt encouraged to seek another provider for chronic anxiety and back pain. Pt verbalized understanding.

## 2012-10-11 NOTE — Telephone Encounter (Addendum)
Pt requesting Ativan 0.5 mg and Flexeril 10 mg x 30 day supply. Ativan was prescribed on 09/25/12 based on medication record.

## 2012-10-11 NOTE — Telephone Encounter (Signed)
Need to know which meds etc

## 2012-10-11 NOTE — Addendum Note (Signed)
Addended by: Lazaro Arms on: 10/11/2012 12:29 PM   Modules accepted: Orders

## 2012-10-11 NOTE — Telephone Encounter (Signed)
Please inform patient that our office will no longer be ordering her flexeril or ativan, we do not do this chronically, just short term, and she will need to seek out another office and provider if she has on going problems with back pain and anxiety.

## 2014-02-04 ENCOUNTER — Encounter (HOSPITAL_COMMUNITY): Payer: Self-pay | Admitting: *Deleted

## 2014-05-01 ENCOUNTER — Encounter (HOSPITAL_COMMUNITY): Payer: Self-pay

## 2014-05-01 ENCOUNTER — Emergency Department (HOSPITAL_COMMUNITY)
Admission: EM | Admit: 2014-05-01 | Discharge: 2014-05-02 | Disposition: A | Discharge status: Home / Self Care | Payer: BLUE CROSS/BLUE SHIELD | Attending: Emergency Medicine | Admitting: Emergency Medicine

## 2014-05-01 DIAGNOSIS — Z720 Tobacco use: Secondary | ICD-10-CM | POA: Diagnosis not present

## 2014-05-01 DIAGNOSIS — Z8751 Personal history of pre-term labor: Secondary | ICD-10-CM | POA: Insufficient documentation

## 2014-05-01 DIAGNOSIS — Z8679 Personal history of other diseases of the circulatory system: Secondary | ICD-10-CM | POA: Insufficient documentation

## 2014-05-01 DIAGNOSIS — J3489 Other specified disorders of nose and nasal sinuses: Secondary | ICD-10-CM | POA: Diagnosis not present

## 2014-05-01 DIAGNOSIS — F329 Major depressive disorder, single episode, unspecified: Secondary | ICD-10-CM | POA: Insufficient documentation

## 2014-05-01 DIAGNOSIS — Y9289 Other specified places as the place of occurrence of the external cause: Secondary | ICD-10-CM | POA: Insufficient documentation

## 2014-05-01 DIAGNOSIS — T1591XA Foreign body on external eye, part unspecified, right eye, initial encounter: Secondary | ICD-10-CM | POA: Diagnosis present

## 2014-05-01 DIAGNOSIS — Z79899 Other long term (current) drug therapy: Secondary | ICD-10-CM | POA: Insufficient documentation

## 2014-05-01 DIAGNOSIS — Y998 Other external cause status: Secondary | ICD-10-CM | POA: Diagnosis not present

## 2014-05-01 DIAGNOSIS — X58XXXA Exposure to other specified factors, initial encounter: Secondary | ICD-10-CM | POA: Diagnosis not present

## 2014-05-01 DIAGNOSIS — Y9389 Activity, other specified: Secondary | ICD-10-CM | POA: Diagnosis not present

## 2014-05-01 DIAGNOSIS — Z8669 Personal history of other diseases of the nervous system and sense organs: Secondary | ICD-10-CM | POA: Insufficient documentation

## 2014-05-01 MED ORDER — TETRACAINE HCL 0.5 % OP SOLN
1.0000 [drp] | Freq: Once | OPHTHALMIC | Status: AC
  Administered 2014-05-01: 1 [drp] via OPHTHALMIC

## 2014-05-01 MED ORDER — TETRACAINE HCL 0.5 % OP SOLN
OPHTHALMIC | Status: AC
  Administered 2014-05-01: 1 [drp] via OPHTHALMIC
  Filled 2014-05-01: qty 2

## 2014-05-01 MED ORDER — FLUORESCEIN SODIUM 1 MG OP STRP
ORAL_STRIP | OPHTHALMIC | Status: AC
  Administered 2014-05-01: 1 via OPHTHALMIC
  Filled 2014-05-01: qty 1

## 2014-05-01 MED ORDER — FLUORESCEIN SODIUM 1 MG OP STRP
1.0000 | ORAL_STRIP | Freq: Once | OPHTHALMIC | Status: AC
  Administered 2014-05-01: 1 via OPHTHALMIC

## 2014-05-01 NOTE — ED Notes (Signed)
Woods lamp at bedside.  

## 2014-05-01 NOTE — ED Notes (Signed)
Right eye red per pt. Almost feels like there is something in it per pt. Also concerned about pink eye.

## 2014-05-02 MED ORDER — KETOROLAC TROMETHAMINE 0.5 % OP SOLN
1.0000 [drp] | Freq: Once | OPHTHALMIC | Status: AC
  Administered 2014-05-02: 1 [drp] via OPHTHALMIC
  Filled 2014-05-02: qty 5

## 2014-05-02 NOTE — Discharge Instructions (Signed)
Eye, Foreign Body The term foreign body refers to any object near, on the surface of or in the eye that should not be there. A foreign body may be a small speck of dirt or dust, a hair or eyelash, a splinter or any object. CAUSES  Foreign bodies can get in the eye by:  Flying pieces of something that was broken or destroyed (debris).  A sudden injury (trauma) to the eye. SYMPTOMS  Symptoms depend on what the foreign body is and where it is in the eye. The most common locations are:  On the inner surface of the upper or lower eyelids or on the covering of the white part of the eye (conjunctiva). Symptoms in this location are:  Irritating and painful, especially when blinking.  Feeling like something is in the eye.  On the surface of the clear covering on the front of the eye (cornea). A corneal foreign body has symptoms that:  Are painful and irritating since the cornea is very sensitive.  Form small "rust rings" around a metallic foreign body. Metallic foreign bodies stick more firmly to the surface of the cornea.  Inside the eyeball. Infection can happen fast and can be hard to treat with antibiotics. This is an extremely dangerous situation. Foreign bodies inside the eye can threaten vision. A person may even loose their eye. Foreign bodies inside the eye may cause:  Great pain.  Immediate loss of vision. DIAGNOSIS  Foreign bodies are found during an exam by an eye specialist. Those that are on the eyelids, conjunctiva or cornea are usually (but not always) easily found. When a foreign body is inside the eyeball, a cataract may form almost right away. This makes it hard for an ophthalmologist to find the foreign body. Special tests may be needed, including ultrasound testing, X-rays and CT scans. TREATMENT   Foreign bodies that are on the eyelids, conjunctiva or cornea are often removed easily and painlessly.  If the foreign body has caused a scratch or abrasion of the cornea,  antibiotic drops, ointments and/or a tight patch called a "pressure patch" may be needed. Follow-up exams will be needed for several days until the abrasion heals.  Surgery is needed right away if the foreign body is inside the eyeball. This is a medical emergency. An antibiotic therapy will likely be given to stop an infection. HOME CARE INSTRUCTIONS  The use of eye patches is not universal. Their use varies from state to state and from caregiver to caregiver. If an eye patch was applied:  Keep the eye patch on for as long as directed by your caregiver until the follow-up appointment.  Do not remove the patch to put in medications unless instructed to do so. When replacing the patch, retape it as it was before. Follow the same procedure if the patch becomes loose.  WARNING: Do not drive or operate machinery while the eye is patched. The ability to judge distances will be impaired.  Only take over-the-counter or prescription medicines for pain, discomfort or fever as directed by the caregiver. If no eye patch was applied:  Keep the eye closed as much as possible. Do not rub the eye.  Wear dark glasses as needed to protect the eyes from bright light.  Do not wear contact lenses until the eye feels normal again, or as instructed.  Wear protective eye covering if there is a risk of eye injury. This is important when working with high speed tools.  Only take over-the-counter or   prescription medicines for pain, discomfort or fever as directed by the caregiver. SEEK IMMEDIATE MEDICAL CARE IF:   Pain increases in the eye or the vision changes.  You or your child has problems with the eye patch.  The injury to the eye appears to be getting larger.  There is discharge from the injured eye.  Swelling and/or soreness (inflammation) develops around the affected eye.  You or your child has an oral temperature above 102 F (38.9 C), not controlled by medicine.  Your baby is older than 3  months with a rectal temperature of 102 F (38.9 C) or higher.  Your baby is 473 months old or younger with a rectal temperature of 100.4 F (38 C) or higher. MAKE SURE YOU:   Understand these instructions.  Will watch your condition.  Will get help right away if you are not doing well or get worse. Document Released: 03/22/2005 Document Revised: 06/14/2011 Document Reviewed: 08/17/2012 [facility] Patient Information 2015 [place], [place]. This information is not intended to replace advice given to you by your health care provider. Make sure you discuss any questions you have with your health care provider.   You may apply one drop of the ketorolac to your right eye every 4 hours as needed which will help with inflammation and discomfort.  Do not continue to take the tobrex you have been applying.

## 2014-05-02 NOTE — ED Provider Notes (Signed)
CSN: [id number]     Arrival date & time 05/01/14  2220 History   First MD Initiated Contact with Patient 05/01/14 2318     Chief Complaint  Patient presents with  . Eye Problem     (Consider location/radiation/quality/duration/timing/severity/associated sxs/prior Treatment) Patient is a 29 y.o. female presenting with eye problem. The history is provided by the patient.  Eye Problem Location:  R eye Quality:  Foreign body sensation Severity:  Moderate Onset quality:  Sudden Duration:  1 day Timing:  Constant Progression:  Unchanged Chronicity:  New Context: foreign body   Context comment:  Was at work when she felt sudden onset of foreign body sensation Foreign body:  Unknown Relieved by:  Nothing Worsened by:  Nothing tried Ineffective treatments:  Commercial eye wash and eye drops Associated symptoms: foreign body sensation and redness   Associated symptoms: no blurred vision, no crusting, no discharge, no headaches, no itching and no photophobia     Past Medical History  Diagnosis Date  . Depression   . Pregnancy induced hypertension   . Preterm labor   . Headache(784.0)   . Pseudoseizures    Past Surgical History  Procedure Laterality Date  . Cesarean section     Family History  Problem Relation Age of Onset  . Anesthesia problems Neg Hx   . Hypotension Neg Hx   . Malignant hyperthermia Neg Hx   . Pseudochol deficiency Neg Hx    History  Substance Use Topics  . Smoking status: Current Every Day Smoker -- 0.50 packs/day    Types: Cigarettes  . Smokeless tobacco: Never Used  . Alcohol Use: No   OB History    Gravida Para Term Preterm AB TAB SAB Ectopic Multiple Living   0 0 0 0 0 2     Review of Systems  Constitutional: Negative for fever and chills.  HENT: Positive for rhinorrhea. Negative for congestion, ear pain, sinus pressure, sore throat, trouble swallowing and voice change.   Eyes: Positive for pain and redness. Negative for blurred  vision, photophobia, discharge, itching and visual disturbance.  Respiratory: Negative for cough, shortness of breath, wheezing and stridor.   Cardiovascular: Negative for chest pain.  Gastrointestinal: Negative for abdominal pain.  Genitourinary: Negative.   Neurological: Negative for headaches.      Allergies  Review of patient's allergies indicates no known allergies.  Home Medications   Prior to Admission medications   Medication Sig Start Date End Date Taking? Authorizing Provider  acetaminophen (TYLENOL) 325 MG tablet Take 650 mg by mouth every 6 (six) hours as needed. Head, stomach ache    Historical Provider, MD  cyclobenzaprine (FLEXERIL) 10 MG tablet Take 1 tablet (10 mg total) by mouth every 8 (eight) hours as needed for muscle spasms. Patient not taking: Reported on 05/01/2014 06/21/12   Lazaro Arms, MD  cyclobenzaprine (FLEXERIL) 10 MG tablet Take 1 tablet (10 mg total) by mouth every 8 (eight) hours as needed for muscle spasms. Patient not taking: Reported on 05/01/2014 10/11/12   Lazaro Arms, MD  levonorgestrel (MIRENA) 20 MCG/24HR IUD 1 each by Intrauterine route once.   Yes Historical Provider, MD  LORazepam (ATIVAN) 0.5 MG tablet Take 1 tablet (0.5 mg total) by mouth 2 (two) times daily as needed for anxiety. Patient not taking: Reported on 05/01/2014 09/25/12   Lazaro Arms, MD  LORazepam (ATIVAN) 0.5 MG tablet Take 1 tablet (0.5 mg total) by mouth 2 (two) times daily as  needed for anxiety. Patient not taking: Reported on 05/01/2014 10/11/12   Lazaro ArmsLuther H Eure, MD  zolpidem (AMBIEN) 10 MG tablet Take 1 tablet (10 mg total) by mouth at bedtime as needed for sleep. Patient not taking: Reported on 05/01/2014 08/04/11 09/03/11  Rhona RaiderJacob J Stinson, DO   BP 126/80 mmHg  Pulse 81  Temp(Src) 98.2 F (36.8 C) (Oral)  Resp 20  Ht 5\' 2"  (1.575 m)  Wt 110 lb (49.896 kg)  BMI 20.11 kg/m2  SpO2 100% Physical Exam  Constitutional: She is oriented to person, place, and time. She appears  well-developed and well-nourished.  HENT:  Head: Normocephalic and atraumatic.  Right Ear: Tympanic membrane and ear canal normal.  Left Ear: Tympanic membrane and ear canal normal.  Nose: No mucosal edema or rhinorrhea.  Mouth/Throat: Uvula is midline, oropharynx is clear and moist and mucous membranes are normal. No oropharyngeal exudate, posterior oropharyngeal edema, posterior oropharyngeal erythema or tonsillar abscesses.  Eyes: EOM are normal. Pupils are equal, round, and reactive to light. Right eye exhibits no chemosis, no discharge and no exudate. Foreign body present in the right eye. Right conjunctiva is injected.  Slit lamp exam:      The right eye shows foreign body. The right eye shows no corneal abrasion, no corneal flare, no corneal ulcer and no fluorescein uptake.  Upper eyelid swept, tiny foreign body found along right medial upper eyelid border.  Cardiovascular: Normal rate.   Pulmonary/Chest: Effort normal. No respiratory distress.  Musculoskeletal: Normal range of motion.  Neurological: She is alert and oriented to person, place, and time.  Skin: Skin is warm and dry. No rash noted.  Psychiatric: She has a normal mood and affect.    ED Course  Procedures (including critical care time) Labs Review Labs Reviewed - No data to display  Imaging Review No results found.   EKG Interpretation None      MDM   Final diagnoses:  Eye foreign body, right, initial encounter    Complete resolution of sx at time of dc.  She was given ketorolac for inflammation.  Prn f/u anticipated.    Burgess AmorJulie Josalin Carneiro, PA-C 05/02/14 [id number]  Geoffery Lyonsouglas Delo, MD 05/02/14 0157

## 2014-05-02 NOTE — ED Notes (Signed)
PA at bedside.

## 2015-02-13 ENCOUNTER — Telehealth: Payer: Self-pay | Admitting: Obstetrics & Gynecology

## 2015-02-13 NOTE — Telephone Encounter (Signed)
Pt requesting date of Mirena insertion and how many years is it good for? Pt informed Mirena (5 years) inserted on 11/09/2011. Pt c/o  Recent spotting and cramping with the IUD. Advised pt to do pregnancy test and offered to make an appt. Pt states will do pregnancy test and will call office back for an appt.

## 2015-02-19 ENCOUNTER — Emergency Department (HOSPITAL_COMMUNITY): Payer: Self-pay

## 2015-02-19 ENCOUNTER — Emergency Department (HOSPITAL_COMMUNITY): Payer: Medicaid Other

## 2015-02-19 ENCOUNTER — Emergency Department (HOSPITAL_COMMUNITY)
Admission: EM | Admit: 2015-02-19 | Discharge: 2015-02-19 | Disposition: A | Discharge status: Home / Self Care | Payer: Self-pay | Attending: Emergency Medicine | Admitting: Emergency Medicine

## 2015-02-19 ENCOUNTER — Encounter (HOSPITAL_COMMUNITY): Payer: Self-pay | Admitting: Emergency Medicine

## 2015-02-19 DIAGNOSIS — R109 Unspecified abdominal pain: Secondary | ICD-10-CM

## 2015-02-19 DIAGNOSIS — Z3202 Encounter for pregnancy test, result negative: Secondary | ICD-10-CM | POA: Insufficient documentation

## 2015-02-19 DIAGNOSIS — N390 Urinary tract infection, site not specified: Secondary | ICD-10-CM | POA: Insufficient documentation

## 2015-02-19 DIAGNOSIS — B9689 Other specified bacterial agents as the cause of diseases classified elsewhere: Secondary | ICD-10-CM

## 2015-02-19 DIAGNOSIS — M549 Dorsalgia, unspecified: Secondary | ICD-10-CM

## 2015-02-19 DIAGNOSIS — Z79899 Other long term (current) drug therapy: Secondary | ICD-10-CM | POA: Insufficient documentation

## 2015-02-19 DIAGNOSIS — Z8659 Personal history of other mental and behavioral disorders: Secondary | ICD-10-CM | POA: Insufficient documentation

## 2015-02-19 DIAGNOSIS — N760 Acute vaginitis: Secondary | ICD-10-CM | POA: Insufficient documentation

## 2015-02-19 DIAGNOSIS — R102 Pelvic and perineal pain: Secondary | ICD-10-CM

## 2015-02-19 LAB — URINALYSIS, ROUTINE W REFLEX MICROSCOPIC
Bilirubin Urine: NEGATIVE
GLUCOSE, UA: NEGATIVE mg/dL
Hgb urine dipstick: NEGATIVE
KETONES UR: NEGATIVE mg/dL
NITRITE: NEGATIVE
PROTEIN: NEGATIVE mg/dL
Specific Gravity, Urine: 1.025 (ref 1.005–1.030)
pH: 6 (ref 5.0–8.0)

## 2015-02-19 LAB — WET PREP, GENITAL
Sperm: NONE SEEN
TRICH WET PREP: NONE SEEN
YEAST WET PREP: NONE SEEN

## 2015-02-19 LAB — URINE MICROSCOPIC-ADD ON

## 2015-02-19 LAB — PREGNANCY, URINE: PREG TEST UR: NEGATIVE

## 2015-02-19 MED ORDER — KETOROLAC TROMETHAMINE 60 MG/2ML IM SOLN
60.0000 mg | Freq: Once | INTRAMUSCULAR | Status: AC
  Administered 2015-02-19: 60 mg via INTRAMUSCULAR
  Filled 2015-02-19: qty 2

## 2015-02-19 MED ORDER — NAPROXEN 250 MG PO TABS: 250.0000 mg | ORAL_TABLET | Freq: Two times a day (BID) | ORAL | Status: DC | PRN

## 2015-02-19 MED ORDER — CEPHALEXIN 500 MG PO CAPS: 500.0000 mg | ORAL_CAPSULE | Freq: Four times a day (QID) | ORAL | Status: DC

## 2015-02-19 MED ORDER — METRONIDAZOLE 500 MG PO TABS: 500.0000 mg | ORAL_TABLET | Freq: Two times a day (BID) | ORAL | Status: DC

## 2015-02-19 NOTE — ED Provider Notes (Signed)
CSN: [id number]     Arrival date & time 02/19/15  1253 History   First MD Initiated Contact with Patient 02/19/15 1302     Chief Complaint  Patient presents with  . Pelvic Pain      HPI Pt was seen at 1340. Per pt, c/o gradual onset and persistence of constant pelvic "pain" for the past 1 week. Has been associated with vaginal discharge. Describes the pain as "sharp" with radiation into her lower back. Denies dysuria/hematuria, no flank pain, no N/V/D, no CP/SOB, no fevers, no rash.     Past Medical History  Diagnosis Date  . Depression   . Pregnancy induced hypertension   . Preterm labor   . Headache(784.0)   . Pseudoseizures    Past Surgical History  Procedure Laterality Date  . Cesarean section     Family History  Problem Relation Age of Onset  . Anesthesia problems Neg Hx   . Hypotension Neg Hx   . Malignant hyperthermia Neg Hx   . Pseudochol deficiency Neg Hx    Social History  Substance Use Topics  . Smoking status: Current Every Day Smoker -- 0.50 packs/day    Types: Cigarettes  . Smokeless tobacco: Never Used  . Alcohol Use: No   OB History    Gravida Para Term Preterm AB TAB SAB Ectopic Multiple Living   2 2 1 1  0 0 0 0 0 2     Review of Systems ROS: Statement: All systems negative except as marked or noted in the HPI; Constitutional: Negative for fever and chills. ; ; Eyes: Negative for eye pain, redness and discharge. ; ; ENMT: Negative for ear pain, hoarseness, nasal congestion, sinus pressure and sore throat. ; ; Cardiovascular: Negative for chest pain, palpitations, diaphoresis, dyspnea and peripheral edema. ; ; Respiratory: Negative for cough, wheezing and stridor. ; ; Gastrointestinal: Negative for nausea, vomiting, diarrhea, abdominal pain, blood in stool, hematemesis, jaundice and rectal bleeding. . ; ; Genitourinary: Negative for dysuria, flank pain and hematuria. ; ; GYN:  +pelvic pain, vaginal discharge. No vaginal bleeding, no vulvar pain. ;;  Musculoskeletal: Negative for back pain and neck pain. Negative for swelling and trauma.; ; Skin: Negative for pruritus, rash, abrasions, blisters, bruising and skin lesion.; ; Neuro: Negative for headache, lightheadedness and neck stiffness. Negative for weakness, altered level of consciousness , altered mental status, extremity weakness, paresthesias, involuntary movement, seizure and syncope.      Allergies  Review of patient's allergies indicates no known allergies.  Home Medications   Prior to Admission medications   Medication Sig Start Date End Date Taking? Authorizing Provider  acetaminophen (TYLENOL) 325 MG tablet Take 650 mg by mouth every 6 (six) hours as needed. Head, stomach ache   Yes Historical Provider, MD  Aspirin-Acetaminophen-Caffeine (GOODY HEADACHE PO) Take 1 packet by mouth 2 (two) times daily.   Yes Historical Provider, MD  levonorgestrel (MIRENA) 20 MCG/24HR IUD 1 each by Intrauterine route once.   Yes Historical Provider, MD  LORazepam (ATIVAN) 0.5 MG tablet Take 1 tablet (0.5 mg total) by mouth 2 (two) times daily as needed for anxiety. 09/25/12  Yes Lazaro ArmsLuther H Eure, MD  oxyCODONE-acetaminophen (PERCOCET/ROXICET) 5-325 MG tablet Take 1 tablet by mouth every 4 (four) hours as needed for severe pain.   Yes Historical Provider, MD  zolpidem (AMBIEN) 10 MG tablet Take 1 tablet (10 mg total) by mouth at bedtime as needed for sleep. Patient not taking: Reported on 05/01/2014 08/04/11 09/03/11  Rhona RaiderJacob J  Stinson, DO   BP 113/79 mmHg  Pulse 100  Temp(Src) 98.5 F (36.9 C) (Oral)  Resp 16  Ht  (1.6 m)  Wt 110 lb (49.896 kg)  BMI 19.49 kg/m2  SpO2 98% Physical Exam 1345: Physical examination:  Nursing notes reviewed; Vital signs and O2 SAT reviewed;  Constitutional: Well developed, Well nourished, Well hydrated, In no acute distress; Head:  Normocephalic, atraumatic; Eyes: EOMI, PERRL, No scleral icterus; ENMT: Mouth and pharynx normal, Mucous membranes moist; Neck: Supple,  Full range of motion, No lymphadenopathy; Cardiovascular: Regular rate and rhythm, No murmur, rub, or gallop; Respiratory: Breath sounds clear & equal bilaterally, No rales, rhonchi, wheezes.  Speaking full sentences with ease, Normal respiratory effort/excursion; Chest: Nontender, Movement normal; Abdomen: Soft, +suprapubic and RLQ tenderness to palp. No rebound or guarding. Nondistended, Normal bowel sounds; Spine:  No midline CS, TS, LS tenderness.;; Genitourinary: No CVA tenderness. Pelvic exam performed with permission of pt and female ED tech assist during exam.  External genitalia w/o lesions. Vaginal vault without discharge.  Cervix w/o lesions, not friable, GC/chlam and wet prep obtained and sent to lab.  Bimanual exam w/o CMT, or left adnexal tenderness. +suprapubic and right pelvic tenderness.; Extremities: Pulses normal, No tenderness, No edema, No calf edema or asymmetry.; Neuro: AA&Ox3, Major CN grossly intact.  Speech clear. No gross focal motor or sensory deficits in extremities. Climbs on and off stretcher easily by herself. Gait steady.; Skin: Color normal, Warm, Dry.   ED Course  Procedures (including critical care time) Labs Review   Imaging Review  I have personally reviewed and evaluated these images and lab results as part of my medical decision-making.   EKG Interpretation None      MDM  MDM Reviewed: previous chart, nursing note and vitals Reviewed previous: labs Interpretation: labs and ultrasound   Results for orders placed or performed during the hospital encounter of 02/19/15  Wet prep, genital  Result Value Ref Range   Yeast Wet Prep HPF POC NONE SEEN NONE SEEN   Trich, Wet Prep NONE SEEN NONE SEEN   Clue Cells Wet Prep HPF POC PRESENT (A) NONE SEEN   WBC, Wet Prep HPF POC MANY (A) NONE SEEN   Sperm NONE SEEN   Pregnancy, urine  Result Value Ref Range   Preg Test, Ur NEGATIVE NEGATIVE  Urinalysis, Routine w reflex microscopic  Result Value Ref Range    Color, Urine YELLOW YELLOW   APPearance CLEAR CLEAR   Specific Gravity, Urine 1.025 1.005 - 1.030   pH 6.0 5.0 - 8.0   Glucose, UA NEGATIVE NEGATIVE mg/dL   Hgb urine dipstick NEGATIVE NEGATIVE   Bilirubin Urine NEGATIVE NEGATIVE   Ketones, ur NEGATIVE NEGATIVE mg/dL   Protein, ur NEGATIVE NEGATIVE mg/dL   Nitrite NEGATIVE NEGATIVE   Leukocytes, UA TRACE (A) NEGATIVE  Urine microscopic-add on  Result Value Ref Range   Squamous Epithelial / LPF 0-5 (A) NONE SEEN   WBC, UA 6-30 0 - 5 WBC/hpf   RBC / HPF 0-5 0 - 5 RBC/hpf   Bacteria, UA FEW (A) NONE SEEN   US Transvaginal Non-ob 02/19/2015  CLINICAL DATA:  Sharp lower abdomen and back pain. EXAM: TRANSABDOMINAL AND TRANSVAGINAL ULTRASOUND OF PELVIS DOPPLER ULTRASOUND OF OVARIES TECHNIQUE: Both transabdominal and transvaginal ultrasound examinations of the pelvis were performed. Transabdominal technique was performed for global imaging of the pelvis including uterus, ovaries, adnexal regions, and pelvic cul-de-sac. It was necessary to proceed with endovaginal exam following the transabdominal exam  to visualize the uterus, endometrium and ovaries. Color and duplex Doppler ultrasound was utilized to evaluate blood flow to the ovaries. COMPARISON:  None. FINDINGS: Uterus Measurements: 8 x 4.3 x 5.8 cm. No fibroids or other mass visualized. Endometrium Thickness: 14.5 mm. The echotexture of the endometrium is inhomogeneous, question blood. An IUD is identified in place. Right ovary Measurements: 5 x 3.7 x 3.9 cm. There is a mild complicated cyst in the right ovary measuring 1.5 cm. This is probably a follicular cyst. Left ovary Measurements: 4.6 x 2.1 x 2.4 cm. Normal appearance/no adnexal mass. Pulsed Doppler evaluation of both ovaries demonstrates normal low-resistance arterial and venous waveforms. Other findings Moderate complex free fluid is identified. IMPRESSION: IUD in place. Inhomogeneous endometrial echotexture, question blood. Recommend  follow-up in 2 weeks to insure resolution and to ensure no underlying masses present. Moderate complex free fluid is identified in the pelvis. Electronically Signed   By: Sherian Rein M.D.   On: 02/19/2015 15:50    Ct Renal Stone Study 02/19/2015  CLINICAL DATA:  Abdominal pain for 1 week worst in the right lower quadrant EXAM: CT ABDOMEN AND PELVIS WITHOUT CONTRAST TECHNIQUE: Multidetector CT imaging of the abdomen and pelvis was performed following the standard protocol without IV contrast. COMPARISON:  Ultrasound obtained earlier in the same day FINDINGS: Lung bases demonstrate some minimal atelectatic changes. The liver, gallbladder, spleen, adrenal glands and pancreas are within normal limits. The kidneys are well visualized bilaterally without renal calculi or obstructive changes. The appendix is well seen and filled with dense material without inflammatory change to suggest appendicitis. The bladder is decompressed. An IUD is noted within the uterus. Free fluid is seen similar to that noted on the prior ultrasound. No bony abnormality is noted. IMPRESSION: Free fluid within the pelvis better characterized on recent ultrasound. IUD in place. No acute intra-abdominal abnormality noted. Electronically Signed   By: Alcide Clever M.D.   On: 02/19/2015 16:38       1345:  During my evaluation: pt stated she took "some old percocet I had a home and it didn't help." Pt then asked: "can I get an IV placed and be given some stronger pain meds through it?" Explained that I will treat her pain, but not initially with IV medicines/narcotics. I will give her an IM injection of toradol. Pt verb understanding.   1645:  Tx for UTI and BV. Dx and testing d/w pt.  Questions answered.  Verb understanding, agreeable to d/c home with outpt f/u.   Samuel Jester, DO 02/23/15 1924

## 2015-02-19 NOTE — Discharge Instructions (Signed)
°Emergency Department Resource Guide °1) Find a Doctor and Pay Out of Pocket °Although you won't have to find out who is covered by your insurance plan, it is a good idea to ask around and get recommendations. You will then need to call the office and see if the doctor you have chosen will accept you as a new patient and what types of options they offer for patients who are self-pay. Some doctors offer discounts or will set up payment plans for their patients who do not have insurance, but you will need to ask so you aren't surprised when you get to your appointment. ° °2) Contact Your Local Health Department °Not all health departments have doctors that can see patients for sick visits, but many do, so it is worth a call to see if yours does. If you don't know where your local health department is, you can check in your phone book. The CDC also has a tool to help you locate your state's health department, and many state websites also have listings of all of their local health departments. ° °3) Find a Walk-in Clinic °If your illness is not likely to be very severe or complicated, you may want to try a walk in clinic. These are popping up all over the country in pharmacies, drugstores, and shopping centers. They're usually staffed by nurse practitioners or physician assistants that have been trained to treat common illnesses and complaints. They're usually fairly quick and inexpensive. However, if you have serious medical issues or chronic medical problems, these are probably not your best option. ° °No Primary Care Doctor: °- Call [facility] at  832-8000 - they can help you locate a primary care doctor that  accepts your insurance, provides certain services, etc. °- Physician Referral Service- 1-800-533-3463 ° °Chronic Pain Problems: °Organization         Address  Phone   Notes  °[facility]  ([phone]) [phone] Patients need to be referred by their primary care doctor.  ° °Medication  Assistance: °Organization         Address  Phone   Notes  °[place] [facility] [address] °[place], [place] [address] (336) 641-8030 --Must be a resident of [place] °-- Must have NO insurance coverage whatsoever (no Medicaid/ Medicare, etc.) °-- The pt. MUST have a primary care doctor that directs their care regularly and follows them in the community °  °[facility]  (866) 331-1348   °[employer]  (888) 892-1162   ° °Agencies that provide inexpensive medical care: °Organization         Address  Phone   Notes  °[facility]  (336) 832-8035   °[facility]    (336) 832-7272   °[facility] [address] °[place], [place] [address] (336) 832-4777   °[facility] [address], °[place] (336) 271-4999   °[facility]    (336) 373-0678   °[facility]    (336) 272-1050   °[facility] ° [address], [place] Phone:  (336) 832-4444, Fax:  (336) 832-4440 Hours of Operation:  9 am - 6 pm, M-F.  Also accepts Medicaid/Medicare and self-pay.  °[facility] ° [address], [place] Phone: (336) [phone], Fax: (336) [phone]. Hours of Operation:  8:30 am - 5:30 pm, M-F.  Also accepts Medicaid and self-pay.  °[facility] [place] [address], [place] Phone: (336) 878-6027   °[facility] [address], [place], [place] (336)723-1848, Ext. 123 Mondays & Thursdays: 7-9 AM.  First 15 patients are seen on a first come, first serve basis. °  ° °Medicaid-accepting [place] Providers: ° °Organization         Address  Phone   Notes  °[facility] [address], [place] (336) 641-2100 Also accepts self-pay patients.  °[facility] [address], [place] ° (336) 856-9996   °[facility] [address], [place]  (336) 288-8857   °[facility] [address] [place] [address], [place] (336) 299-7000   °Veita Bland [address], [place]  ° (336) 373-1557 Only accepts [place] Access Medicaid patients after they have their name applied to their card.  ° °Self-Pay (no insurance) in [place]: ° °Organization         Address  Phone   Notes  °Sickle Cell Patients, [facility] [address], [place] (336) 832-1970   °[facility] [address], [place] (336) 832-4400   °[facility] ° [address], [place] (336) 992-4800   °[facility]/Dr. Osei-Bonsu ° [address] [place] [address], [place] or [address], [place] (336) [phone] Phone number for both [place] and [place] locations is the same.  °[facility] [address], [place] (336) 299-0000   °[facility] [address] [place] [address], [place] or [address] (336) 852-7530 °(336) 878-2260   °[facility] [address], [place] (336) 350-1642, phone; (336) 294-5005, fax Sees patients 1st and 3rd Saturday of every month.  Must not qualify for public or private insurance (i.e. Medicaid, Medicare, [place] Health Choice, Veterans' Benefits) • Household income should be no more than 200% of the poverty level •The clinic cannot treat you if you are pregnant or think you are pregnant • Sexually transmitted diseases are not treated at the clinic.  ° ° °Dental Care: °Organization         Address  Phone  Notes  °[place] [facility] [address],  (336) 641-6152 Accepts children up to age 21 who are enrolled in Medicaid or [place] Health Choice; pregnant women with a Medicaid card; and children who have applied for Medicaid or [place] Health Choice, but were declined, whose parents can pay a reduced fee at time of service.  °[place]  [facility] [place]  [address], [place] (336) 641-7733 Accepts children up to age 21 who are enrolled in Medicaid or [place] Health Choice; pregnant women with a Medicaid card; and children who have applied for Medicaid or [place] Health Choice, but were declined, whose parents can pay a reduced fee at time of service.  °[facility] ° [address],  (336) 641-4533 Patients are seen by appointment only. Walk-ins are not accepted. [facility] will see patients 18 years of age and older. °Monday - Tuesday (8am-5pm) °Most Wednesdays (8:30-5pm) °$30 per visit, cash only  °[facility] ° [address], [place] (336) 641-4533 Patients are seen by appointment only. Walk-ins are not accepted. [facility] will see patients 18 years of age and older. °One   Wednesday Evening (Monthly: Volunteer Based).  $30 per visit, cash only  °[facility]  (919) 537-3737 for adults; Children under age 4, call Graduate Pediatric Dentistry at (919) [phone]. Children aged 4-14, please call (919) [phone] to request a pediatric application. ° Dental services are provided in all areas of dental care including fillings, crowns and bridges, complete and partial dentures, implants, gum treatment, root canals, and extractions. Preventive care is also provided. Treatment is provided to both adults and children. °Patients are selected via a lottery and there is often a waiting list. °  °[facility] [address], °[place] ° (336) 763-8833 [email or web address] °  °[facility] [address], [place], [place] (336)723-1848, Ext. 123 Second and Fourth Thursday of each month, opens at 6:30 AM; Clinic ends at 9 AM.  Patients are seen on a first-come first-served basis, and a limited number are seen during each clinic.  ° °[facility] ° [address], [place], [place] (336) 723-7904    Eligibility Requirements °You must have lived in [place], or [place] counties for at least the last three months. °  You cannot be eligible for state or federal sponsored healthcare insurance, including Veterans Administration, Medicaid, or Medicare. °  You generally cannot be eligible for healthcare insurance through your employer.  °  How to apply: °Eligibility screenings are held every Tuesday and Wednesday afternoon from 1:00 pm until 4:00 pm. You do not need an appointment for the interview!  °[facility] [address], [place], [place] 336-631-2330   °[place] [facility]  336-342-8273   °[facility]  336-703-3100   °[facility]  336-570-6415   ° °Behavioral Health Resources in the Community: °Intensive Outpatient Programs °Organization         Address  Phone  Notes  °[facility] [address], [place], [place] 336-878-6098   °[facility] [address], [place], [place] 336-832-9800   °[facility] [address], [place], [place] ° 336-882-2125   °[facility] [address],  °[place], [place] 1-800-853-5163 or 336-641-4981   °Substance Abuse Resources °Organization         Address  Phone  Notes  °[facility]  336-882-2125   °[facility]  336-784-9470   °[facility]  336-285-9073   °[facility]  336-845-3988   °Residential & Outpatient Substance Abuse Program  1-800-659-3381   °Psychological Services °Organization         Address  Phone  Notes  °[facility]  336- 832-9600   °[facility]  336- 378-7881   °[facility] [address], [place] 1-800-853-5163 or 336-641-4981   ° °Mobile Crisis Teams °Organization         Address  Phone  Notes  °[facility], Mobile Crisis Care Unit  1-877-626-1772   °Assertive °[facility] ° [address]  [place], [place] 336-834-9664   °Sharon DeEsch [address] °[place] 336-554-5454   ° °Self-Help/Support Groups °Organization         Address  Phone             Notes  °[facility] - variety of support groups  336- 373-1402 Call for more information  °Narcotics Anonymous (NA), [facility] [address], °[place]  2 meetings at this location  ° °  Residential Treatment Programs °Organization         Address  Phone  Notes  °[facility] [address],    °[place]  1-866-801-8205   °[facility] ° [address], [place], [place] 704-293-8524   °[facility] [address], [place] 336-845-3988 Admissions: 8am-3pm M-F  °[facility] [address]    °[place], [place] 336-841-1104   °[facility] [address], [place], [place] 336-379-7146   °[facility] [address]  °[place], [place] 336-285-9073   °[facility] Intensive Outpatient [address], [place], [place] 336-852-3033   °[facility] [address]  °[place], [place] 1-877-615-2722 or 336-784-9470   °[facility] [address] [place], [place] 336-227-7417 Accepts Medicaid  °[facility] [address]  °[place] 1-800-659-3381 Substance Abuse/Addiction Treatment  ° °[place] Behavioral Health Resources °Organization         Address  Phone  Notes  °[facility]  (888) 581-9988   °Julie Brannon, PhD [address] [place], [place]   (336) 349-5553 or (336) 951-0000   °[facility]   [address] °[place], [place] (336) 349-4454   °[facility] [address], [place], [place] (336) 342-8316 Insurance/Medicaid/sponsorship through [facility]  °[facility] [address]                                    [place], [place] (336) 342-8316 Therapy/tele-psych/case    °[facility] [address]  ° [place], [place] (336) 349-2233    °Dr. Arfeen  (336) 349-4544   °[facility] [place]  [employer] [place] [facility] 1) [address],  °2) [address], [place] °3)  [address], [place] (336) 349-3220 °(336) 342-7768 ° °(336) 342-8140   °[place] Child Abuse Hotline (336) 342-1394 or (336) 342-3537 (After Hours)    ° ° °Take the prescriptions as directed.  Apply moist heat to the area(s) of discomfort, for 15 minutes at a time, several times per day for the next few days.  Do not fall asleep on a heating pack.  Call your regular OB/GYN doctor tomorrow to schedule a follow up appointment this week.  Return to the Emergency Department immediately if worsening. ° °

## 2015-02-19 NOTE — ED Notes (Signed)
C/o sharp lower abdomen and back pain, rates pain 9/10.  Notice mucus in urine when voiding, denies burning or itching.

## 2015-02-20 LAB — GC/CHLAMYDIA PROBE AMP (~~LOC~~) NOT AT ARMC
Chlamydia: POSITIVE — AB
NEISSERIA GONORRHEA: NEGATIVE

## 2015-02-25 ENCOUNTER — Telehealth (HOSPITAL_BASED_OUTPATIENT_CLINIC_OR_DEPARTMENT_OTHER): Payer: Self-pay | Admitting: Emergency Medicine

## 2015-02-25 NOTE — Telephone Encounter (Signed)
Post ED Visit - Positive Culture Follow-up: Successful Patient Follow-Up  Culture assessed and recommendations reviewed by: []  Enzo BiNathan Batchelder, Pharm.D. []  Celedonio MiyamotoJeremy Frens, Pharm.D., BCPS []  Garvin FilaMike Maccia, Pharm.D. []  Georgina PillionElizabeth Martin, Pharm.D., BCPS []  BloomburgMinh Pham, [address]Pharm.D., BCPS, AAHIVP []  Estella HuskMichelle Turner, Pharm.D., BCPS, AAHIVP []  Tennis Mustassie Stewart, Pharm.D. []  Sherle Poeob Vincent, [address]arm.D.  Positive chlamydia culture  [x]  Patient discharged without antimicrobial prescription and treatment is now indicated []  Organism is resistant to prescribed ED discharge antimicrobial []  Patient with positive blood cultures  Changes discussed with ED provider: Effie ShyWentz New antibiotic prescription zithromax 1 gram po x 1 dose [facility] [place]  Contacted patient, 02/25/2015 1125   Berle MullMiller, Katiejo [place] 02/25/2015, 11:24 AM

## 2015-04-06 ENCOUNTER — Encounter (HOSPITAL_COMMUNITY): Payer: Self-pay | Admitting: Emergency Medicine

## 2015-04-06 ENCOUNTER — Emergency Department (HOSPITAL_COMMUNITY)
Admission: EM | Admit: 2015-04-06 | Discharge: 2015-04-06 | Disposition: A | Discharge status: Home / Self Care | Payer: Medicaid Other | Attending: Emergency Medicine | Admitting: Emergency Medicine

## 2015-04-06 DIAGNOSIS — Z8659 Personal history of other mental and behavioral disorders: Secondary | ICD-10-CM | POA: Insufficient documentation

## 2015-04-06 DIAGNOSIS — Z792 Long term (current) use of antibiotics: Secondary | ICD-10-CM | POA: Insufficient documentation

## 2015-04-06 DIAGNOSIS — L03011 Cellulitis of right finger: Secondary | ICD-10-CM | POA: Insufficient documentation

## 2015-04-06 DIAGNOSIS — F17210 Nicotine dependence, cigarettes, uncomplicated: Secondary | ICD-10-CM | POA: Insufficient documentation

## 2015-04-06 DIAGNOSIS — Z79899 Other long term (current) drug therapy: Secondary | ICD-10-CM | POA: Insufficient documentation

## 2015-04-06 MED ORDER — DOXYCYCLINE HYCLATE 100 MG PO TABS
100.0000 mg | ORAL_TABLET | Freq: Once | ORAL | Status: AC
  Administered 2015-04-06: 100 mg via ORAL
  Filled 2015-04-06: qty 1

## 2015-04-06 MED ORDER — HYDROCODONE-ACETAMINOPHEN 5-325 MG PO TABS: 1.0000 | ORAL_TABLET | ORAL | Status: DC | PRN

## 2015-04-06 MED ORDER — POVIDONE-IODINE 10 % EX SOLN
CUTANEOUS | Status: AC
  Administered 2015-04-06: 21:00:00
  Filled 2015-04-06: qty 118

## 2015-04-06 MED ORDER — DOXYCYCLINE HYCLATE 100 MG PO CAPS: 100.0000 mg | ORAL_CAPSULE | Freq: Two times a day (BID) | ORAL | Status: DC

## 2015-04-06 MED ORDER — LIDOCAINE HCL (PF) 2 % IJ SOLN
10.0000 mL | Freq: Once | INTRAMUSCULAR | Status: AC
  Administered 2015-04-06: 10 mL
  Filled 2015-04-06: qty 10

## 2015-04-06 NOTE — ED Provider Notes (Signed)
CSN: [id number]     Arrival date & time 04/06/15  1814 History  By signing my name below, I, Gabriela Taylor, attest that this documentation has been prepared under the direction and in the presence of Ivery QualeHobson Noeli Lavery, PA-C. Electronically Signed: Placido SouLogan Taylor, ED Scribe. 04/06/2015 7:10 PM.   Chief Complaint  Patient presents with  . Hand Pain    finger   Patient is a 30 y.o. female presenting with hand pain. The history is provided by the patient. No language interpreter was used.  Hand Pain This is a new problem. The current episode started more than 2 days ago. The problem occurs constantly. The problem has been gradually worsening. She has tried nothing for the symptoms. The treatment provided no relief.    HPI Comments: Gabriela Taylor is a 30 y.o. female who presents to the Emergency Department complaining of worsening, moderate, pain and swelling to her right index finger with onset 2 days ago. She denies any recent injuries to the affected region. She denies any possibility of pregnancy or taking any regular medications. She denies any other associated symptoms at this time.   Past Medical History  Diagnosis Date  . Depression   . Pregnancy induced hypertension   . Preterm labor   . Headache(784.0)   . Pseudoseizures    Past Surgical History  Procedure Laterality Date  . Cesarean section     Family History  Problem Relation Age of Onset  . Anesthesia problems Neg Hx   . Hypotension Neg Hx   . Malignant hyperthermia Neg Hx   . Pseudochol deficiency Neg Hx    Social History  Substance Use Topics  . Smoking status: Current Every Day Smoker -- 0.50 packs/day    Types: Cigarettes  . Smokeless tobacco: Never Used  . Alcohol Use: No   OB History    Gravida Para Term Preterm AB TAB SAB Ectopic Multiple Living   2 2 1 1  0 0 0 0 0 2     Review of Systems  Musculoskeletal: Positive for joint swelling and arthralgias.  Skin: Positive for color change.  All other systems  reviewed and are negative.   Allergies  Review of patient's allergies indicates no known allergies.  Home Medications   Prior to Admission medications   Medication Sig Start Date End Date Taking? Authorizing Provider  acetaminophen (TYLENOL) 325 MG tablet Take 650 mg by mouth every 6 (six) hours as needed. Head, stomach ache    Historical Provider, MD  Aspirin-Acetaminophen-Caffeine (GOODY HEADACHE PO) Take 1 packet by mouth 2 (two) times daily.    Historical Provider, MD  cephALEXin (KEFLEX) 500 MG capsule Take 1 capsule (500 mg total) by mouth 4 (four) times daily. 02/19/15   Samuel JesterKathleen McManus, DO  levonorgestrel (MIRENA) 20 MCG/24HR IUD 1 each by Intrauterine route once.    Historical Provider, MD  LORazepam (ATIVAN) 0.5 MG tablet Take 1 tablet (0.5 mg total) by mouth 2 (two) times daily as needed for anxiety. 09/25/12   Lazaro ArmsLuther H Eure, MD  metroNIDAZOLE (FLAGYL) 500 MG tablet Take 1 tablet (500 mg total) by mouth 2 (two) times daily. 02/19/15   Samuel JesterKathleen McManus, DO  naproxen (NAPROSYN) 250 MG tablet Take 1 tablet (250 mg total) by mouth 2 (two) times daily as needed for mild pain or moderate pain (take with food). 02/19/15   Samuel JesterKathleen McManus, DO  oxyCODONE-acetaminophen (PERCOCET/ROXICET) 5-325 MG tablet Take 1 tablet by mouth every 4 (four) hours as needed for severe pain.  Historical Provider, MD  zolpidem (AMBIEN) 10 MG tablet Take 1 tablet (10 mg total) by mouth at bedtime as needed for sleep. Patient not taking: Reported on 05/01/2014 08/04/11 09/03/11  Rhona Raider Stinson, DO   BP 148/101 mmHg  Pulse 75  Temp(Src) 98.1 F (36.7 C) (Oral)  Resp 18  Ht 5\' 2"  (1.575 m)  Wt 115 lb (52.164 kg)  BMI 21.03 kg/m2  SpO2 99%    Physical Exam  Constitutional: She is oriented to person, place, and time. She appears well-developed and well-nourished.  HENT:  Head: Normocephalic and atraumatic.  Neck: Normal range of motion.  Cardiovascular: Normal rate, regular rhythm, normal heart sounds  and intact distal pulses.  Exam reveals no gallop and no friction rub.   No murmur heard. Pulmonary/Chest: Effort normal and breath sounds normal. No respiratory distress. She has no wheezes. She has no rales. She exhibits no tenderness.  Musculoskeletal: Normal range of motion.  No palpable nodes in the bicep/tricep area; infected paronychia involving the right distal index finger; no evidence of trauma or puncture wounds  Neurological: She is alert and oriented to person, place, and time.  Skin: Skin is warm and dry. She is not diaphoretic.  Psychiatric: She has a normal mood and affect. Her behavior is normal.  Nursing note and vitals reviewed.   ED Course  .Marland KitchenIncision and Drainage Date/Time: 04/06/2015 8:25 PM Performed by: Ivery Quale Authorized by: Ivery Quale Risks and benefits: risks, benefits and alternatives were discussed Consent given by: patient Patient understanding: patient states understanding of the procedure being performed Patient identity confirmed: arm band Time out: Immediately prior to procedure a "time out" was called to verify the correct patient, procedure, equipment, support staff and site/side marked as required. Indications for incision and drainage: paronychia. Body area: upper extremity Location details: right index finger Anesthesia: digital block Local anesthetic: lidocaine 2% without epinephrine Patient sedated: no Scalpel size: 11 Complexity: simple Drainage: purulent Drainage amount: scant Wound treatment: wound left open Patient tolerance: Patient tolerated the procedure well with no immediate complications    DIAGNOSTIC STUDIES: Oxygen Saturation is 99% on RA, normal by my interpretation.    COORDINATION OF CARE: 7:08 PM Pt presents today due pain in her right index finger. Discussed next steps with pt including I&D of the paronychia, doxycycline and norco. She agreed to the plan.   Labs Review Labs Reviewed - No data to  display  Imaging Review No results found.   EKG Interpretation None      MDM  Patient has what appears to be a paronychia involving the right index finger. Incision and drainage has been carried out on. Culture sent to the lab. The patient will be placed on doxycycline and Norco. The patient is to see the primary physician, or return to the emergency department if any changes or problems.    Final diagnoses:  None    *I have reviewed nursing notes, vital signs, and all appropriate lab and imaging results for this patient.**  **I personally performed the services described in this documentation, which was scribed in my presence. The recorded information has been reviewed and is accurate.Ivery Quale, PA-C 04/06/15 2028  Mancel Bale, MD 04/07/15 [phone]

## 2015-04-06 NOTE — ED Notes (Signed)
Onset Friday, index finger right hand,red swollen, painful

## 2015-05-22 ENCOUNTER — Emergency Department (HOSPITAL_COMMUNITY)
Admission: EM | Admit: 2015-05-22 | Discharge: 2015-05-22 | Disposition: A | Discharge status: Home / Self Care | Payer: Medicaid Other | Attending: Emergency Medicine | Admitting: Emergency Medicine

## 2015-05-22 ENCOUNTER — Encounter (HOSPITAL_COMMUNITY): Payer: Self-pay | Admitting: Emergency Medicine

## 2015-05-22 DIAGNOSIS — F329 Major depressive disorder, single episode, unspecified: Secondary | ICD-10-CM | POA: Insufficient documentation

## 2015-05-22 DIAGNOSIS — H00025 Hordeolum internum left lower eyelid: Secondary | ICD-10-CM | POA: Insufficient documentation

## 2015-05-22 DIAGNOSIS — H00016 Hordeolum externum left eye, unspecified eyelid: Secondary | ICD-10-CM

## 2015-05-22 DIAGNOSIS — F17210 Nicotine dependence, cigarettes, uncomplicated: Secondary | ICD-10-CM | POA: Insufficient documentation

## 2015-05-22 DIAGNOSIS — Z792 Long term (current) use of antibiotics: Secondary | ICD-10-CM | POA: Insufficient documentation

## 2015-05-22 MED ORDER — TOBRAMYCIN 0.3 % OP SOLN
2.0000 [drp] | OPHTHALMIC | Status: DC
  Administered 2015-05-22: 2 [drp] via OPHTHALMIC

## 2015-05-22 MED ORDER — TOBRAMYCIN 0.3 % OP SOLN
OPHTHALMIC | Status: AC
  Administered 2015-05-22: 2 [drp] via OPHTHALMIC
  Filled 2015-05-22: qty 5

## 2015-05-22 NOTE — ED Notes (Signed)
Swelling to left lower eye.  Denies any pain or injury at this time.

## 2015-05-22 NOTE — Discharge Instructions (Signed)
Stye °A stye is a bump on your eyelid caused by a bacterial infection. A stye can form inside the eyelid (internal stye) or outside the eyelid (external stye). An internal stye may be caused by an infected oil-producing gland inside your eyelid. An external stye may be caused by an infection at the base of your eyelash (hair follicle). °Styes are very common. Anyone can get them at any age. They usually occur in just one eye, but you may have more than one in either eye.  °CAUSES  °The infection is almost always caused by bacteria called Staphylococcus aureus. This is a common type of bacteria that lives on your skin. °RISK FACTORS °You may be at higher risk for a stye if you have had one before. You may also be at higher risk if you have: °· Diabetes. °· Long-term illness. °· Long-term eye redness. °· A skin condition called seborrhea. °· High fat levels in your blood (lipids). °SIGNS AND SYMPTOMS  °Eyelid pain is the most common symptom of a stye. Internal styes are more painful than external styes. Other signs and symptoms may include: °· Painful swelling of your eyelid. °· A scratchy feeling in your eye. °· Tearing and redness of your eye. °· Pus draining from the stye. °DIAGNOSIS  °Your health care provider may be able to diagnose a stye just by examining your eye. The health care provider may also check to make sure: °· You do not have a fever or other signs of a more serious infection. °· The infection has not spread to other parts of your eye or areas around your eye. °TREATMENT  °Most styes will clear up in a few days without treatment. In some cases, you may need to use antibiotic drops or ointment to prevent infection. Your health care provider may have to drain the stye surgically if your stye is: °· Large. °· Causing a lot of pain. °· Interfering with your vision. °This can be done using a thin blade or a needle.  °HOME CARE INSTRUCTIONS  °· Take medicines only as directed by your health care  provider. °· Apply a clean, warm compress to your eye for 10 minutes, 4 times a day. °· Do not wear contact lenses or eye makeup until your stye has healed. °· Do not try to pop or drain the stye. °SEEK MEDICAL CARE IF: °· You have chills or a fever. °· Your stye does not go away after several days. °· Your stye affects your vision. °· Your eyeball becomes swollen, red, or painful. °MAKE SURE YOU: °· Understand these instructions. °· Will watch your condition. °· Will get help right away if you are not doing well or get worse. °  °This information is not intended to replace advice given to you by your health care provider. Make sure you discuss any questions you have with your health care provider. °  °Document Released: 12/30/2004 Document Revised: 04/12/2014 Document Reviewed: 07/06/2013 °Elsevier Interactive Patient Education ©2016 Elsevier Inc. ° °Stye °A stye is a bump on your eyelid caused by a bacterial infection. A stye can form inside the eyelid (internal stye) or outside the eyelid (external stye). An internal stye may be caused by an infected oil-producing gland inside your eyelid. An external stye may be caused by an infection at the base of your eyelash (hair follicle). °Styes are very common. Anyone can get them at any age. They usually occur in just one eye, but you may have more than one in   either eye.  CAUSES  The infection is almost always caused by bacteria called Staphylococcus aureus. This is a common type of bacteria that lives on your skin. RISK FACTORS You may be at higher risk for a stye if you have had one before. You may also be at higher risk if you have:  Diabetes.  Long-term illness.  Long-term eye redness.  A skin condition called seborrhea.  High fat levels in your blood (lipids). SIGNS AND SYMPTOMS  Eyelid pain is the most common symptom of a stye. Internal styes are more painful than external styes. Other signs and symptoms may include:  Painful swelling of your  eyelid.  A scratchy feeling in your eye.  Tearing and redness of your eye.  Pus draining from the stye. DIAGNOSIS  Your health care provider may be able to diagnose a stye just by examining your eye. The health care provider may also check to make sure:  You do not have a fever or other signs of a more serious infection.  The infection has not spread to other parts of your eye or areas around your eye. TREATMENT  Most styes will clear up in a few days without treatment. In some cases, you may need to use antibiotic drops or ointment to prevent infection. Your health care provider may have to drain the stye surgically if your stye is:  Large.  Causing a lot of pain.  Interfering with your vision. This can be done using a thin blade or a needle.  HOME CARE INSTRUCTIONS   Take medicines only as directed by your health care provider.  Apply a clean, warm compress to your eye for 10 minutes, 4 times a day.  Do not wear contact lenses or eye makeup until your stye has healed.  Do not try to pop or drain the stye. SEEK MEDICAL CARE IF:  You have chills or a fever.  Your stye does not go away after several days.  Your stye affects your vision.  Your eyeball becomes swollen, red, or painful. MAKE SURE YOU:  Understand these instructions.  Will watch your condition.  Will get help right away if you are not doing well or get worse.   This information is not intended to replace advice given to you by your health care provider. Make sure you discuss any questions you have with your health care provider.   Document Released: 12/30/2004 Document Revised: 04/12/2014 Document Reviewed: 07/06/2013 Elsevier Interactive Patient Education [employer]2016 Elsevier Inc[employer]

## 2015-05-23 NOTE — ED Provider Notes (Signed)
CSN: [id number]     Arrival date & time 05/22/15  1313 History   First MD Initiated Contact with Patient 05/22/15 1420     Chief Complaint  Patient presents with  . Eye Problem     (Consider location/radiation/quality/duration/timing/severity/associated sxs/prior Treatment) Patient is a 30 y.o. female presenting with eye problem. The history is provided by the patient. No language interpreter was used.  Eye Problem Location:  L eye Quality:  Aching Severity:  Moderate Onset quality:  Gradual Duration:  1 week Timing:  Constant Chronicity:  New Context: tanning booth use   Relieved by:  Nothing Worsened by:  Nothing tried Ineffective treatments:  None tried Associated symptoms: no blurred vision, no decreased vision, no discharge and no double vision   Pt complains of a sty on her lower  Eyelid.   Past Medical History  Diagnosis Date  . Depression   . Pregnancy induced hypertension   . Preterm labor   . Headache(784.0)   . Pseudoseizures    Past Surgical History  Procedure Laterality Date  . Cesarean section     Family History  Problem Relation Age of Onset  . Anesthesia problems Neg Hx   . Hypotension Neg Hx   . Malignant hyperthermia Neg Hx   . Pseudochol deficiency Neg Hx    Social History  Substance Use Topics  . Smoking status: Current Every Day Smoker -- 0.50 packs/day    Types: Cigarettes  . Smokeless tobacco: Never Used  . Alcohol Use: No   OB History    Gravida Para Term Preterm AB TAB SAB Ectopic Multiple Living   0 0 0 0 0 2     Review of Systems  Eyes: Negative for blurred vision, double vision and discharge.  All other systems reviewed and are negative.     Allergies  Review of patient's allergies indicates no known allergies.  Home Medications   Prior to Admission medications   Medication Sig Start Date End Date Taking? Authorizing Provider  acetaminophen (TYLENOL) 325 MG tablet Take 650 mg by mouth every 6 (six) hours as  needed. Head, stomach ache    Historical Provider, MD  Aspirin-Acetaminophen-Caffeine (GOODY HEADACHE PO) Take 1 packet by mouth 2 (two) times daily as needed (Pain).     Historical Provider, MD  cephALEXin (KEFLEX) 500 MG capsule Take 1 capsule (500 mg total) by mouth 4 (four) times daily. 02/19/15   Samuel Jester, DO  doxycycline (VIBRAMYCIN) 100 MG capsule Take 1 capsule (100 mg total) by mouth 2 (two) times daily. 04/06/15   Ivery Quale, PA-C  HYDROcodone-acetaminophen (NORCO/VICODIN) 5-325 MG tablet Take 1 tablet by mouth every 4 (four) hours as needed. 04/06/15   Ivery Quale, PA-C  HYDROcodone-acetaminophen (NORCO/VICODIN) 5-325 MG tablet Take 1-2 tablets by mouth every 4 (four) hours as needed. 04/06/15   Ivery Quale, PA-C  levonorgestrel (MIRENA) 20 MCG/24HR IUD 1 each by Intrauterine route once.    Historical Provider, MD  LORazepam (ATIVAN) 0.5 MG tablet Take 1 tablet (0.5 mg total) by mouth 2 (two) times daily as needed for anxiety. 09/25/12   Lazaro Arms, MD  metroNIDAZOLE (FLAGYL) 500 MG tablet Take 1 tablet (500 mg total) by mouth 2 (two) times daily. 02/19/15   Samuel Jester, DO  naproxen (NAPROSYN) 250 MG tablet Take 1 tablet (250 mg total) by mouth 2 (two) times daily as needed for mild pain or moderate pain (take with food). 02/19/15   Samuel Jester, DO  oxyCODONE-acetaminophen (  PERCOCET/ROXICET) 5-325 MG tablet Take 1 tablet by mouth every 4 (four) hours as needed for severe pain.    Historical Provider, MD  zolpidem (AMBIEN) 10 MG tablet Take 1 tablet (10 mg total) by mouth at bedtime as needed for sleep. Patient not taking: Reported on 05/01/2014 08/04/11 09/03/11  Rhona Raider Stinson, DO   BP 125/70 mmHg  Pulse 98  Temp(Src) 98.1 F (36.7 C) (Oral)  Resp 16  Ht  (1.575 m)  Wt 52.164 kg  BMI 21.03 kg/m2  SpO2 99% Physical Exam  Constitutional: She is oriented to person, place, and time. She appears well-developed and well-nourished.  HENT:  Head: Normocephalic  and atraumatic.  Eyes: Conjunctivae and EOM are normal. Pupils are equal, round, and reactive to light.  Stye left lower eyelid,   Neck: Normal range of motion.  Cardiovascular: Normal rate and normal heart sounds.   Pulmonary/Chest: Effort normal.  Abdominal: She exhibits no distension.  Musculoskeletal: Normal range of motion.  Neurological: She is alert and oriented to person, place, and time.  Psychiatric: She has a normal mood and affect.  Nursing note and vitals reviewed.   ED Course  Procedures (including critical care time) Labs Review Labs Reviewed - No data to display  Imaging Review No results found. I have personally reviewed and evaluated these images and lab results as part of my medical decision-making.   EKG Interpretation None      MDM   Final diagnoses:  Stye, left    Meds ordered this encounter  Medications  . tobramycin (TOBREX) 0.3 % ophthalmic solution    Sig:     Frye-Yarbrough, Susa: cabinet override  . DISCONTD: tobramycin (TOBREX) 0.3 % ophthalmic solution 2 drop    Sig:   An After Visit Summary was printed and given to the patient.    Elson Areas, PA-C 05/23/15 0832  Elson Areas, PA-C 05/23/15 1610  Mancel Bale, MD 05/23/15 [phone]

## 2015-06-21 ENCOUNTER — Emergency Department (HOSPITAL_COMMUNITY): Payer: Self-pay

## 2015-06-21 ENCOUNTER — Emergency Department (HOSPITAL_COMMUNITY)
Admission: EM | Admit: 2015-06-21 | Discharge: 2015-06-21 | Disposition: A | Discharge status: Home / Self Care | Payer: Self-pay | Attending: Emergency Medicine | Admitting: Emergency Medicine

## 2015-06-21 ENCOUNTER — Encounter (HOSPITAL_COMMUNITY): Payer: Self-pay | Admitting: Emergency Medicine

## 2015-06-21 DIAGNOSIS — Z7982 Long term (current) use of aspirin: Secondary | ICD-10-CM | POA: Insufficient documentation

## 2015-06-21 DIAGNOSIS — Y999 Unspecified external cause status: Secondary | ICD-10-CM | POA: Insufficient documentation

## 2015-06-21 DIAGNOSIS — Z79899 Other long term (current) drug therapy: Secondary | ICD-10-CM | POA: Insufficient documentation

## 2015-06-21 DIAGNOSIS — F17210 Nicotine dependence, cigarettes, uncomplicated: Secondary | ICD-10-CM | POA: Insufficient documentation

## 2015-06-21 DIAGNOSIS — F329 Major depressive disorder, single episode, unspecified: Secondary | ICD-10-CM | POA: Insufficient documentation

## 2015-06-21 DIAGNOSIS — Y9289 Other specified places as the place of occurrence of the external cause: Secondary | ICD-10-CM | POA: Insufficient documentation

## 2015-06-21 DIAGNOSIS — Y939 Activity, unspecified: Secondary | ICD-10-CM | POA: Insufficient documentation

## 2015-06-21 DIAGNOSIS — S0990XA Unspecified injury of head, initial encounter: Secondary | ICD-10-CM | POA: Insufficient documentation

## 2015-06-21 DIAGNOSIS — S01312A Laceration without foreign body of left ear, initial encounter: Secondary | ICD-10-CM | POA: Insufficient documentation

## 2015-06-21 MED ORDER — HYDROCODONE-ACETAMINOPHEN 5-325 MG PO TABS: 1.0000 | ORAL_TABLET | Freq: Four times a day (QID) | ORAL | Status: DC | PRN

## 2015-06-21 MED ORDER — KETOROLAC TROMETHAMINE 30 MG/ML IJ SOLN
60.0000 mg | Freq: Once | INTRAMUSCULAR | Status: AC
  Administered 2015-06-21: 60 mg via INTRAMUSCULAR
  Filled 2015-06-21: qty 2

## 2015-06-21 MED ORDER — OXYCODONE-ACETAMINOPHEN 5-325 MG PO TABS: 1.0000 | ORAL_TABLET | Freq: Four times a day (QID) | ORAL | Status: DC | PRN

## 2015-06-21 MED ORDER — LIDOCAINE HCL (PF) 1 % IJ SOLN
INTRAMUSCULAR | Status: AC
  Administered 2015-06-21: 5 mL
  Filled 2015-06-21: qty 5

## 2015-06-21 MED ORDER — CEPHALEXIN 500 MG PO CAPS: 500.0000 mg | ORAL_CAPSULE | Freq: Two times a day (BID) | ORAL | Status: DC

## 2015-06-21 MED ORDER — OXYCODONE-ACETAMINOPHEN 5-325 MG PO TABS
1.0000 | ORAL_TABLET | Freq: Once | ORAL | Status: AC
  Administered 2015-06-21: 1 via ORAL
  Filled 2015-06-21: qty 1
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
## 2015-06-21 NOTE — Discharge Instructions (Signed)
Follow up in 6-7 days to have sutures out.  Clean laceration twice a day with soap and water
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
## 2015-07-01 ENCOUNTER — Emergency Department (HOSPITAL_COMMUNITY)
Admission: EM | Admit: 2015-07-01 | Discharge: 2015-07-01 | Disposition: A | Discharge status: Home / Self Care | Payer: Medicaid Other | Attending: Emergency Medicine | Admitting: Emergency Medicine

## 2015-07-01 ENCOUNTER — Encounter (HOSPITAL_COMMUNITY): Payer: Self-pay | Admitting: *Deleted

## 2015-07-01 DIAGNOSIS — F329 Major depressive disorder, single episode, unspecified: Secondary | ICD-10-CM | POA: Insufficient documentation

## 2015-07-01 DIAGNOSIS — Z4802 Encounter for removal of sutures: Secondary | ICD-10-CM

## 2015-07-01 DIAGNOSIS — F17210 Nicotine dependence, cigarettes, uncomplicated: Secondary | ICD-10-CM | POA: Insufficient documentation

## 2015-07-01 DIAGNOSIS — Z79899 Other long term (current) drug therapy: Secondary | ICD-10-CM | POA: Insufficient documentation

## 2015-07-01 DIAGNOSIS — Z7982 Long term (current) use of aspirin: Secondary | ICD-10-CM | POA: Insufficient documentation

## 2015-07-01 DIAGNOSIS — R000 Tachycardia, unspecified: Secondary | ICD-10-CM | POA: Insufficient documentation

## 2015-07-01 DIAGNOSIS — Z792 Long term (current) use of antibiotics: Secondary | ICD-10-CM | POA: Insufficient documentation

## 2015-07-01 MED ORDER — BACITRACIN ZINC 500 UNIT/GM EX OINT
TOPICAL_OINTMENT | CUTANEOUS | Status: AC
  Filled 2015-07-01: qty 0.9

## 2015-07-01 NOTE — Discharge Instructions (Signed)

## 2015-07-01 NOTE — ED Notes (Signed)
Pt here to have stitches removed 

## 2015-07-01 NOTE — ED Provider Notes (Signed)
CSN: [id number]     Arrival date & time 07/01/15  2012 History   First MD Initiated Contact with Patient 07/01/15 2057     Chief Complaint  Patient presents with  . stitches removed      (Consider location/radiation/quality/duration/timing/severity/associated sxs/prior Treatment) HPI Gabriela ChristineChristine M Taylor is a 30 y.o. female who presents to the ED for suture removal. The sutures are located to the back of the left ear. She denies any problems.   Past Medical History  Diagnosis Date  . Depression   . Pregnancy induced hypertension   . Preterm labor   . Headache(784.0)   . Pseudoseizures    Past Surgical History  Procedure Laterality Date  . Cesarean section     Family History  Problem Relation Age of Onset  . Anesthesia problems Neg Hx   . Hypotension Neg Hx   . Malignant hyperthermia Neg Hx   . Pseudochol deficiency Neg Hx    Social History  Substance Use Topics  . Smoking status: Current Every Day Smoker -- 0.50 packs/day    Types: Cigarettes  . Smokeless tobacco: Never Used  . Alcohol Use: No   OB History    Gravida Para Term Preterm AB TAB SAB Ectopic Multiple Living   2 2 1 1  0 0 0 0 0 2     Review of Systems Negative except as stated in HPI   Allergies  Review of patient's allergies indicates no known allergies.  Home Medications   Prior to Admission medications   Medication Sig Start Date End Date Taking? Authorizing Provider  Aspirin-Acetaminophen-Caffeine (GOODY HEADACHE PO) Take 1 packet by mouth 2 (two) times daily as needed (Pain).     Historical Provider, MD  cephALEXin (KEFLEX) 500 MG capsule Take 1 capsule (500 mg total) by mouth 2 (two) times daily. 06/21/15   Bethann BerkshireJoseph Zammit, MD  cephALEXin (KEFLEX) 500 MG capsule Take 1 capsule (500 mg total) by mouth 2 (two) times daily. 06/21/15   Bethann BerkshireJoseph Zammit, MD  doxycycline (VIBRAMYCIN) 100 MG capsule Take 1 capsule (100 mg total) by mouth 2 (two) times daily. Patient not taking: Reported on 06/21/2015 04/06/15    Ivery QualeHobson Bryant, PA-C  HYDROcodone-acetaminophen (NORCO/VICODIN) 5-325 MG tablet Take 1 tablet by mouth every 6 (six) hours as needed. 06/21/15   Bethann BerkshireJoseph Zammit, MD  levonorgestrel (MIRENA) 20 MCG/24HR IUD 1 each by Intrauterine route once.    Historical Provider, MD  naproxen (NAPROSYN) 250 MG tablet Take 1 tablet (250 mg total) by mouth 2 (two) times daily as needed for mild pain or moderate pain (take with food). Patient not taking: Reported on 06/21/2015 02/19/15   Samuel JesterKathleen McManus, DO  oxyCODONE-acetaminophen (PERCOCET/ROXICET) 5-325 MG tablet Take 1 tablet by mouth every 6 (six) hours as needed. 06/21/15   Bethann BerkshireJoseph Zammit, MD  zolpidem (AMBIEN) 10 MG tablet Take 1 tablet (10 mg total) by mouth at bedtime as needed for sleep. Patient not taking: Reported on 05/01/2014 08/04/11 09/03/11  Rhona RaiderJacob J Stinson, DO   BP 141/103 mmHg  Pulse 106  Temp(Src) 98.6 F (37 C) (Oral)  Resp 20  Ht 5\' 3"  (1.6 m)  Wt 52.164 kg  BMI 20.38 kg/m2  SpO2 96% Physical Exam  Constitutional: She is oriented to person, place, and time. She appears well-developed and well-nourished. No distress.  HENT:  Healing wound to the posterior aspect of the left ear without signs of infection.   Eyes: EOM are normal.  Neck: Neck supple.  Cardiovascular: Tachycardia present.   Pulmonary/Chest:  Effort normal.  Abdominal: Soft. There is no tenderness.  Musculoskeletal: Normal range of motion.  Neurological: She is alert and oriented to person, place, and time. No cranial nerve deficit.  Skin: Skin is warm and dry.  Psychiatric: She has a normal mood and affect. Her behavior is normal.  Nursing note and vitals reviewed.   ED Course  Procedures (including critical care time) Labs Review  MDM  30 y.o. female here for suture removal stable for d/c without signs of infection. Discussed with patient elevated BP and need for f/u.   Final diagnoses:  Visit for suture removal       Janne Napoleon, NP 07/01/15 2102  Glynn Octave, MD 07/02/15 [phone]

## 2015-07-31 ENCOUNTER — Emergency Department (HOSPITAL_COMMUNITY)
Admission: EM | Admit: 2015-07-31 | Discharge: 2015-07-31 | Disposition: A | Discharge status: Home / Self Care | Payer: Medicaid Other | Attending: Emergency Medicine | Admitting: Emergency Medicine

## 2015-07-31 ENCOUNTER — Encounter (HOSPITAL_COMMUNITY): Payer: Self-pay | Admitting: Emergency Medicine

## 2015-07-31 DIAGNOSIS — F17210 Nicotine dependence, cigarettes, uncomplicated: Secondary | ICD-10-CM | POA: Insufficient documentation

## 2015-07-31 DIAGNOSIS — F329 Major depressive disorder, single episode, unspecified: Secondary | ICD-10-CM | POA: Insufficient documentation

## 2015-07-31 DIAGNOSIS — F1123 Opioid dependence with withdrawal: Secondary | ICD-10-CM | POA: Insufficient documentation

## 2015-07-31 DIAGNOSIS — R197 Diarrhea, unspecified: Secondary | ICD-10-CM | POA: Insufficient documentation

## 2015-07-31 DIAGNOSIS — F1193 Opioid use, unspecified with withdrawal: Secondary | ICD-10-CM

## 2015-07-31 LAB — RAPID URINE DRUG SCREEN, HOSP PERFORMED
AMPHETAMINES: NOT DETECTED
BARBITURATES: NOT DETECTED
BENZODIAZEPINES: POSITIVE — AB
Cocaine: NOT DETECTED
Opiates: POSITIVE — AB
TETRAHYDROCANNABINOL: POSITIVE — AB

## 2015-07-31 LAB — PREGNANCY, URINE: PREG TEST UR: NEGATIVE

## 2015-07-31 LAB — COMPREHENSIVE METABOLIC PANEL
ALT: 16 U/L (ref 14–54)
AST: 21 U/L (ref 15–41)
Albumin: 4.8 g/dL (ref 3.5–5.0)
Alkaline Phosphatase: 63 U/L (ref 38–126)
Anion gap: 11 (ref 5–15)
BUN: 18 mg/dL (ref 6–20)
CHLORIDE: 103 mmol/L (ref 101–111)
CO2: 27 mmol/L (ref 22–32)
CREATININE: 0.75 mg/dL (ref 0.44–1.00)
Calcium: 9.4 mg/dL (ref 8.9–10.3)
GFR calc non Af Amer: >60 mL/min (ref >60)
Glucose, Bld: 101 mg/dL — ABNORMAL HIGH (ref 65–99)
POTASSIUM: 3.9 mmol/L (ref 3.5–5.1)
SODIUM: 141 mmol/L (ref 135–145)
Total Bilirubin: 1 mg/dL (ref 0.3–1.2)
Total Protein: 8.6 g/dL — ABNORMAL HIGH (ref 6.5–8.1)

## 2015-07-31 LAB — CBC
HEMATOCRIT: 46.2 % — AB (ref 36.0–46.0)
Hemoglobin: 15.7 g/dL — ABNORMAL HIGH (ref 12.0–15.0)
MCH: 31.3 pg (ref 26.0–34.0)
MCHC: 34 g/dL (ref 30.0–36.0)
MCV: 92 fL (ref 78.0–100.0)
PLATELETS: 338 10*3/uL (ref 150–400)
RBC: 5.02 MIL/uL (ref 3.87–5.11)
RDW: 14 % (ref 11.5–15.5)
WBC: 14.4 10*3/uL — AB (ref 4.0–10.5)

## 2015-07-31 LAB — ETHANOL: ALCOHOL ETHYL (B): 6 mg/dL — AB (ref <5)

## 2015-07-31 MED ORDER — ONDANSETRON 4 MG PO TBDP
4.0000 mg | ORAL_TABLET | Freq: Once | ORAL | Status: AC
  Administered 2015-07-31: 4 mg via ORAL
  Filled 2015-07-31: qty 1

## 2015-07-31 MED ORDER — DICYCLOMINE HCL 10 MG PO CAPS
10.0000 mg | ORAL_CAPSULE | Freq: Once | ORAL | Status: AC
  Administered 2015-07-31: 10 mg via ORAL
  Filled 2015-07-31: qty 1

## 2015-07-31 MED ORDER — ONDANSETRON HCL 4 MG PO TABS: 4.0000 mg | ORAL_TABLET | Freq: Four times a day (QID) | ORAL | Status: DC

## 2015-07-31 MED ORDER — LORAZEPAM 1 MG PO TABS
1.0000 mg | ORAL_TABLET | ORAL | Status: DC | PRN
  Administered 2015-07-31: 1 mg via ORAL
  Filled 2015-07-31: qty 1

## 2015-07-31 MED ORDER — CLONIDINE HCL 0.1 MG PO TABS
0.1000 mg | ORAL_TABLET | Freq: Once | ORAL | Status: AC
  Administered 2015-07-31: 0.1 mg via ORAL
  Filled 2015-07-31: qty 1

## 2015-07-31 MED ORDER — DICYCLOMINE HCL 20 MG PO TABS: 20.0000 mg | ORAL_TABLET | Freq: Two times a day (BID) | ORAL | Status: DC

## 2015-07-31 MED ORDER — CLONIDINE HCL 0.1 MG/24HR TD PTWK
0.1000 mg | MEDICATED_PATCH | Freq: Once | TRANSDERMAL | Status: DC
  Administered 2015-07-31: 0.1 mg via TRANSDERMAL
  Filled 2015-07-31: qty 1

## 2015-07-31 MED ORDER — LORAZEPAM 2 MG/ML IJ SOLN: 0.5000 mg | INTRAMUSCULAR | Status: DC | PRN

## 2015-07-31 NOTE — ED Provider Notes (Signed)
CSN: [id number]     Arrival date & time 07/31/15  1405 History   First MD Initiated Contact with Patient 07/31/15 1528     Chief Complaint  Patient presents with  . Medical Clearance      HPI  Reason for evaluation requesting medical clearance to go to detox Tx at [facility]..  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  She has a 30-year-old child.  States she wants to stop and she states that she is already signed up to go to [facility] in [place].  Past Medical History  Diagnosis Date  . Depression   . Pregnancy induced hypertension   . Preterm labor   . Headache(784.0)   . Pseudoseizures    Past Surgical History  Procedure Laterality Date  . Cesarean section     Family History  Problem Relation Age of Onset  . Anesthesia problems Neg Hx   . Hypotension Neg Hx   . Malignant hyperthermia Neg Hx   . Pseudochol deficiency Neg Hx    Social History  Substance Use Topics  . Smoking status: Current Every Day Smoker -- 0.50 packs/day    Types: Cigarettes  . Smokeless tobacco: Never Used  . 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 Negative for fever, diaphoresis, appetite change and fatigue.  HENT: Negative for mouth sores, sore throat and trouble swallowing.   Eyes: Negative for visual disturbance.  Respiratory: Negative for cough, chest tightness, shortness of breath and wheezing.   Cardiovascular: Negative for chest pain.  Gastrointestinal: Positive for nausea and diarrhea. Negative for vomiting, abdominal pain and abdominal distention.  Endocrine: Negative for polydipsia, polyphagia and polyuria.  Genitourinary: Negative for dysuria, frequency and hematuria.  Musculoskeletal: Negative for gait problem.  Skin: Negative for color change, pallor and rash.  Neurological: Positive for  tremors. Negative for dizziness, syncope, light-headedness and headaches.  Hematological: Does not bruise/bleed easily.  Psychiatric/Behavioral: Negative for behavioral problems and confusion.      Allergies  Review of patient's allergies indicates no known allergies.  Home Medications   Prior to Admission medications   Medication Sig Start Date End Date Taking? Authorizing Provider  Aspirin-Acetaminophen-Caffeine (GOODY HEADACHE PO) Take 1 packet by mouth 2 (two) times daily as needed (Pain).     Historical Provider, MD  cephALEXin (KEFLEX) 500 MG capsule Take 1 capsule (500 mg total) by mouth 2 (two) times daily. 06/21/15   Bethann Berkshire, MD  cephALEXin (KEFLEX) 500 MG capsule Take 1 capsule (500 mg total) by mouth 2 (two) times daily. 06/21/15   Bethann Berkshire, MD  dicyclomine (BENTYL) 20 MG tablet Take 1 tablet (20 mg total) by mouth 2 (two) times daily. 07/31/15   Rolland Porter, MD  doxycycline (VIBRAMYCIN) 100 MG capsule Take 1 capsule (100 mg total) by mouth 2 (two) times daily. Patient not taking: Reported on 06/21/2015 04/06/15   Ivery Quale, PA-C  HYDROcodone-acetaminophen (NORCO/VICODIN) 5-325 MG tablet Take 1 tablet by mouth every 6 (six) hours as needed. 06/21/15   Bethann Berkshire, MD  levonorgestrel (MIRENA) 20 MCG/24HR IUD 1 each by Intrauterine route once.    Historical Provider, MD  naproxen (NAPROSYN) 250 MG tablet Take 1 tablet (250 mg total) by mouth 2 (two)  times daily as needed for mild pain or moderate pain (take with food). Patient not taking: Reported on 06/21/2015 02/19/15   Samuel JesterKathleen McManus, DO  ondansetron (ZOFRAN) 4 MG tablet Take 1 tablet (4 mg total) by mouth every 6 (six) hours. 07/31/15   Rolland PorterMark Kambrie Eddleman, MD  oxyCODONE-acetaminophen (PERCOCET/ROXICET) 5-325 MG tablet Take 1 tablet by mouth every 6 (six) hours as needed. 06/21/15   Bethann BerkshireJoseph Zammit, MD  zolpidem (AMBIEN) 10 MG tablet Take 1 tablet (10 mg total) by mouth at bedtime as needed for sleep. Patient not taking: Reported  on 05/01/2014 08/04/11 09/03/11  Rhona RaiderJacob J Stinson, DO   BP 163/99 mmHg  Pulse 76  Temp(Src) 97.3 F (36.3 C) (Tympanic)  Resp 16  Ht 5\' 3"  (1.6 m)  Wt 110 lb (49.896 kg)  BMI 19.49 kg/m2  SpO2 95% Physical Exam  Constitutional: She is oriented to person, place, and time. She appears well-developed and well-nourished. No distress.  HENT:  Head: Normocephalic.  Eyes: Conjunctivae are normal. Pupils are equal, round, and reactive to light. No scleral icterus.  Neck: Normal range of motion. Neck supple. No thyromegaly present.  Cardiovascular: Normal rate and regular rhythm.  Exam reveals no gallop and no friction rub.   No murmur heard. Pulmonary/Chest: Effort normal and breath sounds normal. No respiratory distress. She has no wheezes. She has no rales.  Abdominal: Soft. Bowel sounds are normal. She exhibits no distension. There is no tenderness. There is no rebound.  Musculoskeletal: Normal range of motion.  Neurological: She is alert and oriented to person, place, and time.  Skin: Skin is warm and dry. No rash noted.  Psychiatric:  Depressed mood.  Anxious.  Awake and alert.    ED Course  Procedures (including critical care time) Labs Review Labs Reviewed  ETHANOL - Abnormal; Notable for the following:    Alcohol, Ethyl (B) 6 (*)    All other components within normal limits  COMPREHENSIVE METABOLIC PANEL - Abnormal; Notable for the following:    Glucose, Bld 101 (*)    Total Protein 8.6 (*)    All other components within normal limits  CBC - Abnormal; Notable for the following:    WBC 14.4 (*)    Hemoglobin 15.7 (*)    HCT 46.2 (*)    All other components within normal limits  URINE RAPID DRUG SCREEN, HOSP PERFORMED - Abnormal; Notable for the following:    Opiates POSITIVE (*)    Benzodiazepines POSITIVE (*)    Tetrahydrocannabinol POSITIVE (*)    All other components within normal limits  PREGNANCY, URINE    Imaging Review No results found. I have personally  reviewed and evaluated these images and lab results as part of my medical decision-making.   EKG Interpretation None      MDM   Final diagnoses:  Opiate withdrawal (HCC)    I [facility] with staff [facility] in [place] .  She is not able to self present there.  She has to be referred through [facility] mobile crisis team.  I spoke with the intake coordinator Melissa at [facility].  She stated that if the patient contacted them they could activate her to services/mobile crisis team for urgent engagement.  Also given the patient the number for a  [facility] locally for walk-in's with the detox.    Rolland PorterMark Orestes Geiman, MD 07/31/15 [phone]

## 2015-07-31 NOTE — ED Notes (Signed)
Pt here for medical clearance for [facility].  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

## 2015-07-31 NOTE — Discharge Instructions (Signed)
°  To be [facility] you must be evaluated by mobil crisis team from [facility]. Call in am at [phone]. [facility] is also available for walk in treatment evaluations. They are at [address] in [place].  Prescriptions given to help with withdrawal side effects.  Opioid Withdrawal Opioids are a group of narcotic drugs. They include the street drug heroin. They also include pain medicines, such as morphine, hydrocodone, oxycodone, and fentanyl. Opioid withdrawal is a group of characteristic physical and mental signs and symptoms. It typically occurs if you have been using opioids daily for several weeks or longer and stop using or rapidly decrease use. Opioid withdrawal can also occur if you have used opioids daily for a long time and are given a medicine to block the effect.  SIGNS AND SYMPTOMS Opioid withdrawal includes three or more of the following symptoms:   Depressed, anxious, or irritable mood.  Nausea or vomiting.  Muscle aches or spasms.   Watery eyes.   Runny nose.  Dilated pupils, sweating, or hairs standing on end.  Diarrhea or intestinal cramping.  Yawning.   Fever.  Increased blood pressure.  Fast pulse.  Restlessness or trouble sleeping. These signs and symptoms occur within several hours of stopping or reducing short-acting opioids, such as heroin. They can occur within 3 days of stopping or reducing long-acting opioids, such as methadone. Withdrawal begins within minutes of receiving a drug that blocks the effects of opioids, such as naltrexone or naloxone. DIAGNOSIS  Opioid use disorder is diagnosed by your health care provider. You will be asked about your symptoms, drug and alcohol use, medical history, and use of medicines. A physical exam may be done. Lab tests may be ordered. Your health care provider may have you see a mental health professional.  TREATMENT  The treatment for opioid withdrawal is usually provided by medical doctors  with special training in substance use disorders (addiction specialists). The following medicines may be included in treatment:  Opioids given in place of the abused opioid. They turn on opioid receptors in the brain and lessen or prevent withdrawal symptoms. They are gradually decreased (opioid substitution and taper).  Non-opioids that can lessen certain opioid withdrawal symptoms. They may be used alone or with opioid substitution and taper. Successful long-term recovery usually requires medicine, counseling, and group support. HOME CARE INSTRUCTIONS   Take medicines only as directed by your health care provider.  Check with your health care provider before starting new medicines.  Keep all follow-up visits as directed by your health care provider. SEEK MEDICAL CARE IF:  You are not able to take your medicines as directed.  Your symptoms get worse.  You relapse. SEEK IMMEDIATE MEDICAL CARE IF:  You have serious thoughts about hurting yourself or others.  You have a seizure.  You lose consciousness.   This information is not intended to replace advice given to you by your health care provider. Make sure you discuss any questions you have with your health care provider.   Document Released: 03/25/2003 Document Revised: 04/12/2014 Document Reviewed: 04/04/2013 Elsevier Interactive Patient Education [employer]

## 2016-06-08 ENCOUNTER — Encounter (HOSPITAL_COMMUNITY): Payer: Self-pay | Admitting: *Deleted

## 2016-06-08 ENCOUNTER — Emergency Department (HOSPITAL_COMMUNITY)
Admission: EM | Admit: 2016-06-08 | Discharge: 2016-06-08 | Disposition: A | Discharge status: Home / Self Care | Payer: Medicaid Other | Attending: Emergency Medicine | Admitting: Emergency Medicine

## 2016-06-08 DIAGNOSIS — Z79899 Other long term (current) drug therapy: Secondary | ICD-10-CM | POA: Insufficient documentation

## 2016-06-08 DIAGNOSIS — J0300 Acute streptococcal tonsillitis, unspecified: Secondary | ICD-10-CM | POA: Diagnosis not present

## 2016-06-08 DIAGNOSIS — R220 Localized swelling, mass and lump, head: Secondary | ICD-10-CM | POA: Diagnosis present

## 2016-06-08 DIAGNOSIS — F17210 Nicotine dependence, cigarettes, uncomplicated: Secondary | ICD-10-CM | POA: Diagnosis not present

## 2016-06-08 MED ORDER — PENICILLIN V POTASSIUM 500 MG PO TABS: 500.0000 mg | ORAL_TABLET | Freq: Two times a day (BID) | ORAL | 0 refills | Status: AC

## 2016-06-08 MED ORDER — IBUPROFEN 600 MG PO TABS: 600.0000 mg | ORAL_TABLET | Freq: Four times a day (QID) | ORAL | 0 refills | Status: DC | PRN

## 2016-06-08 MED ORDER — DEXAMETHASONE SODIUM PHOSPHATE 10 MG/ML IJ SOLN
10.0000 mg | Freq: Once | INTRAMUSCULAR | Status: AC
  Administered 2016-06-08: 10 mg via INTRAMUSCULAR
  Filled 2016-06-08: qty 1

## 2016-06-08 MED ORDER — IBUPROFEN 400 MG PO TABS
600.0000 mg | ORAL_TABLET | Freq: Once | ORAL | Status: AC
  Administered 2016-06-08: 600 mg via ORAL
  Filled 2016-06-08: qty 2

## 2016-06-08 NOTE — ED Notes (Signed)
Pt waiting at nursing desk for discharge paperwork, discharge given, pt expressed understanding,

## 2016-06-08 NOTE — ED Provider Notes (Signed)
[facility]-EMERG[facility]Y DEPT Provider Note   CSN: [id number] Arrival date & time: 06/08/16  1630     History   Chief Complaint Chief Complaint  Patient presents with  . Oral Swelling    HPI Gabriela Taylor is a 31 y.o. female.  HPI Patient states she began having diffuse myalgias and fatigue starting yesterday. She had some abdominal cramping and treated outside loose stools. Also admitted to nausea. This morning she woke up with a sore throat. States she felt swelling inside of her throat. Pain with swallowing. No voice changes. Admits to low-grade fever. Denies congestion, cough or headache. No new rashes. Patient states her son has similar symptoms. Denies any difficulty breathing. Past Medical History:  Diagnosis Date  . Depression   . Headache(784.0)   . Pregnancy induced hypertension   . Preterm labor   . Pseudoseizures     Patient Active Problem List   Diagnosis Date Noted  . Pregnancy, normal, incidental 09/02/2011    Past Surgical History:  Procedure Laterality Date  . CESAREAN SECTION      OB History    Gravida Para Term Preterm AB Living   2 2 1 1  0 2   SAB TAB Ectopic Multiple Live Births   0 0 0 0 1       Home Medications    Prior to Admission medications   Medication Sig Start Date End Date Taking? Authorizing Provider  Aspirin-Acetaminophen-Caffeine (GOODY HEADACHE PO) Take 1 packet by mouth 2 (two) times daily as needed (Pain).    Yes Historical Provider, MD  levonorgestrel (MIRENA) 20 MCG/24HR IUD 1 each by Intrauterine route once.   Yes Historical Provider, MD  ibuprofen (ADVIL,MOTRIN) 600 MG tablet Take 1 tablet (600 mg total) by mouth every 6 (six) hours as needed. 06/08/16   Loren Raceravid Marche Hottenstein, MD  penicillin v potassium (VEETID) 500 MG tablet Take 1 tablet (500 mg total) by mouth 2 (two) times daily. 06/08/16 06/18/16  Loren Raceravid Ludger Bones, MD    Family History Family History  Problem Relation Age of Onset  . Anesthesia problems Neg Hx   . Hypotension  Neg Hx   . Malignant hyperthermia Neg Hx   . Pseudochol deficiency Neg Hx     Social History Social History  Substance Use Topics  . Smoking status: Current Every Day Smoker    Packs/day: 0.50    Types: Cigarettes  . Smokeless tobacco: Never Used  . 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   Review of Systems Review of Systems  Constitutional: Positive for activity change, appetite change, chills, fatigue and fever.  HENT: Positive for sore throat. Negative for congestion, rhinorrhea, sinus pain, sinus pressure, trouble swallowing and voice change.   Respiratory: Negative for cough, shortness of breath, wheezing and stridor.   Cardiovascular: Negative for chest pain, palpitations and leg swelling.  Gastrointestinal: Positive for abdominal pain, diarrhea and nausea. Negative for constipation and vomiting.  Genitourinary: Negative for dysuria, flank pain and frequency.  Musculoskeletal: Positive for myalgias. Negative for arthralgias, neck pain and neck stiffness.  Skin: Negative for rash and wound.  Neurological: Negative for dizziness, weakness, light-headedness, numbness and headaches.  All other systems reviewed and are negative.    Physical Exam Updated Vital Signs BP 126/84   Pulse 87   Temp 99.2 F (37.3 C) (Oral)   Resp 18   Ht 5\' 3"  (1.6 m)  Wt 130 lb (59 kg)   SpO2 94%   BMI 23.03 kg/m   Physical Exam  Constitutional: She is oriented to person, place, and time. She appears well-developed and well-nourished. No distress.  HENT:  Head: Normocephalic and atraumatic.  Mouth/Throat: Oropharyngeal exudate present.  Bilateral tonsillar hypertrophy with exudates. Erythematous oropharynx. Uvula is midline. No trismus.  Eyes: EOM are normal. Pupils are equal, round, and reactive to light.  Neck: Normal range of motion. Neck supple.  No meningismus  Cardiovascular: Normal rate and regular  rhythm.  Exam reveals no gallop and no friction rub.   No murmur heard. Pulmonary/Chest: Effort normal and breath sounds normal. No stridor. No respiratory distress. She has no wheezes. She has no rales. She exhibits no tenderness.  Abdominal: Soft. Bowel sounds are normal. There is no tenderness. There is no rebound and no guarding.  Musculoskeletal: Normal range of motion. She exhibits no edema or tenderness.  Lymphadenopathy:    She has cervical adenopathy.  Neurological: She is alert and oriented to person, place, and time.  Skin: Skin is warm and dry. No rash noted. No erythema.  Psychiatric: She has a normal mood and affect. Her behavior is normal.  Nursing note and vitals reviewed.    ED Treatments / Results  Labs (all labs ordered are listed, but only abnormal results are displayed) Labs Reviewed - No data to display  EKG  EKG Interpretation None       Radiology No results found.  Procedures Procedures (including critical care time)  Medications Ordered in ED Medications  dexamethasone (DECADRON) injection 10 mg (10 mg Intramuscular Given 06/08/16 1800)  ibuprofen (ADVIL,MOTRIN) tablet 600 mg (600 mg Oral Given 06/08/16 1759)     Initial Impression / Assessment and Plan / ED Course  I have reviewed the triage vital signs and the nursing notes.  Pertinent labs & imaging results that were available during my care of the patient were reviewed by me and considered in my medical decision making (see chart for details).     Patient with evidence of strep throat. Airway is intact. We'll treat symptomatically and reevaluate. Patient says she's feeling much better after Decadron and ibuprofen. Airway remains stable. Will discharge home with antibiotics. Return precautions been given. Final Clinical Impressions(s) / ED Diagnoses   Final diagnoses:  Strep tonsillitis    New Prescriptions New Prescriptions   IBUPROFEN (ADVIL,MOTRIN) 600 MG TABLET    Take 1 tablet (600  mg total) by mouth every 6 (six) hours as needed.   PENICILLIN V POTASSIUM (VEETID) 500 MG TABLET    Take 1 tablet (500 mg total) by mouth 2 (two) times daily.     Loren Racer, MD 06/08/16 [phone]

## 2016-06-08 NOTE — ED Notes (Signed)
Pt at the nurses station requesting discharge papers. Informed pt that the EDP and RN were still in a procedure and they would be in to speak with her as soon as possible. Pt given ginger ale per request

## 2016-06-08 NOTE — ED Triage Notes (Signed)
Pt comes in for sore throat and swelling starting yesterday. Pt throat is swollen with airway intact. Pt is moving air with no distress. Oxygen 96%. Pt has been having diarrhea and vomiting as well. Thi started yesterday.

## 2016-06-08 NOTE — ED Notes (Signed)
Pt out to nursing desk asking about discharge paperwork, pt informed that EDP was in a procedure and RN would speak with him once the procedure was done, pt given ginger ale per request,

## 2016-12-31 ENCOUNTER — Emergency Department (HOSPITAL_COMMUNITY)
Admission: EM | Admit: 2016-12-31 | Discharge: 2016-12-31 | Disposition: A | Discharge status: Home / Self Care | Payer: Self-pay | Attending: Emergency Medicine | Admitting: Emergency Medicine

## 2016-12-31 ENCOUNTER — Encounter (HOSPITAL_COMMUNITY): Payer: Self-pay | Admitting: *Deleted

## 2016-12-31 DIAGNOSIS — Z79899 Other long term (current) drug therapy: Secondary | ICD-10-CM | POA: Insufficient documentation

## 2016-12-31 DIAGNOSIS — K648 Other hemorrhoids: Secondary | ICD-10-CM | POA: Insufficient documentation

## 2016-12-31 DIAGNOSIS — R109 Unspecified abdominal pain: Secondary | ICD-10-CM | POA: Insufficient documentation

## 2016-12-31 DIAGNOSIS — K5900 Constipation, unspecified: Secondary | ICD-10-CM | POA: Insufficient documentation

## 2016-12-31 DIAGNOSIS — F17210 Nicotine dependence, cigarettes, uncomplicated: Secondary | ICD-10-CM | POA: Insufficient documentation

## 2016-12-31 LAB — CBC
HCT: 39.8 % (ref 36.0–46.0)
HEMOGLOBIN: 13.3 g/dL (ref 12.0–15.0)
MCH: 31.7 pg (ref 26.0–34.0)
MCHC: 33.4 g/dL (ref 30.0–36.0)
MCV: 94.8 fL (ref 78.0–100.0)
Platelets: 276 10*3/uL (ref 150–400)
RBC: 4.2 MIL/uL (ref 3.87–5.11)
RDW: 13.3 % (ref 11.5–15.5)
WBC: 11.2 10*3/uL — AB (ref 4.0–10.5)

## 2016-12-31 LAB — BASIC METABOLIC PANEL
ANION GAP: 8 (ref 5–15)
BUN: 15 mg/dL (ref 6–20)
CHLORIDE: 107 mmol/L (ref 101–111)
CO2: 26 mmol/L (ref 22–32)
Calcium: 8.6 mg/dL — ABNORMAL LOW (ref 8.9–10.3)
Creatinine, Ser: 0.63 mg/dL (ref 0.44–1.00)
GFR calc Af Amer: >60 mL/min (ref >60)
Glucose, Bld: 150 mg/dL — ABNORMAL HIGH (ref 65–99)
POTASSIUM: 3.2 mmol/L — AB (ref 3.5–5.1)
SODIUM: 141 mmol/L (ref 135–145)

## 2016-12-31 MED ORDER — DOCUSATE SODIUM 100 MG PO CAPS: 100.0000 mg | ORAL_CAPSULE | Freq: Every day | ORAL | 20 refills | Status: AC | PRN

## 2016-12-31 MED ORDER — POLYETHYLENE GLYCOL 3350 17 GM/SCOOP PO POWD: 17.0000 g | Freq: Every day | ORAL | 0 refills | Status: DC

## 2016-12-31 NOTE — ED Provider Notes (Signed)
[facility] Provider Note   CSN: [id number] Arrival date & time: 12/31/16  1857  History   Chief Complaint Chief Complaint  Patient presents with  . GI Bleeding    HPI Gabriela Taylor is a 31 y.o. female presenting with BRB per rectum for the past month, over the past few days the amount of blood has been increasing to the point where today she had several clots after wiping. She also endorses abdominal cramping prior to having bowel movement. Denies tarry stool. No reflux, hx of gastric ulcers. Denies SOB, palpitations, fatigue, pallor.   HPI  Past Medical History:  Diagnosis Date  . Depression   . Headache(784.0)   . Pregnancy induced hypertension   . Preterm labor   . Pseudoseizures     Patient Active Problem List   Diagnosis Date Noted  . Pregnancy, normal, incidental 09/02/2011    Past Surgical History:  Procedure Laterality Date  . CESAREAN SECTION      OB History    Gravida Para Term Preterm AB Living   0 2   SAB TAB Ectopic Multiple Live Births   0 0 0 0 1       Home Medications    Prior to Admission medications   Medication Sig Start Date End Date Taking? Authorizing Provider  levonorgestrel (MIRENA) 20 MCG/24HR IUD 1 each by Intrauterine route once.   Yes [provider]  docusate sodium (COLACE) 100 MG capsule Take 1 capsule (100 mg total) by mouth daily as needed. 12/31/16 12/31/17  Tillman Sers, DO  ibuprofen (ADVIL,MOTRIN) 600 MG tablet Take 1 tablet (600 mg total) by mouth every 6 (six) hours as needed. Patient not taking: Reported on 12/31/2016 06/08/16   Loren Racer, MD  polyethylene glycol powder [facility]) powder Take 17 g by mouth daily. 12/31/16   Tillman Sers, DO    Family History Family History  Problem Relation Age of Onset  . Anesthesia problems Neg Hx   . Hypotension Neg Hx   . Malignant hyperthermia Neg Hx   . Pseudochol deficiency Neg Hx     Social History Social History  Substance Use  Topics  . Smoking status: Current Every Day Smoker    Packs/day: 0.50    Types: Cigarettes  . Smokeless tobacco: Never Used  . 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   Review of Systems Review of Systems  Constitutional: Negative for activity change, appetite change, chills, fatigue and fever.  HENT: Negative for congestion, rhinorrhea and sore throat.   Eyes: Negative for visual disturbance.  Respiratory: Negative for shortness of breath.   Cardiovascular: Negative for chest pain and palpitations.  Gastrointestinal: Positive for blood in stool and constipation. Negative for abdominal pain, diarrhea, nausea, rectal pain and vomiting.  Genitourinary: Negative for dysuria, urgency and vaginal bleeding.  Musculoskeletal: Negative for back pain.  Skin: Negative for pallor.  Neurological: Negative for dizziness, syncope, weakness and light-headedness.  Psychiatric/Behavioral: Negative for confusion.     Physical Exam Updated Vital Signs Ht  (1.6 m)   Wt 54 kg (119 lb)   BMI 21.08 kg/m   Physical Exam  Constitutional: She is oriented to person, place, and time. She appears well-developed and well-nourished. No distress.  HENT:  Head: Normocephalic and atraumatic.  Mouth/Throat: Oropharynx is clear and moist. No oropharyngeal exudate.  Eyes: Pupils are  equal, round, and reactive to light. Conjunctivae and EOM are normal.  Neck: Normal range of motion. Neck supple.  Cardiovascular: Normal rate, regular rhythm, normal heart sounds and intact distal pulses.   Pulmonary/Chest: Effort normal and breath sounds normal. No respiratory distress.  Abdominal: Soft. She exhibits no distension. There is no tenderness. There is no guarding.  Genitourinary: Rectal exam shows internal hemorrhoid. Rectal exam shows no external hemorrhoid, no fissure, no mass, no tenderness and anal tone normal.  Musculoskeletal:  Normal range of motion.  Neurological: She is alert and oriented to person, place, and time. She exhibits normal muscle tone.  Skin: Skin is warm and dry. No rash noted. No pallor.  Psychiatric: She has a normal mood and affect. Her behavior is normal. Judgment and thought content normal.     ED Treatments / Results  Labs (all labs ordered are listed, but only abnormal results are displayed) Labs Reviewed  CBC - Abnormal; Notable for the following:       Result Value   WBC 11.2 (*)    All other components within normal limits  BASIC METABOLIC PANEL - Abnormal; Notable for the following:    Potassium 3.2 (*)    Glucose, Bld 150 (*)    Calcium 8.6 (*)    All other components within normal limits    EKG  EKG Interpretation None       Radiology No results found.  Procedures Procedures (including critical care time)  Medications Ordered in ED Medications - No data to display   Initial Impression / Assessment and Plan / ED Course  I have reviewed the triage vital signs and the nursing notes.  Pertinent labs & imaging results that were available during my care of the patient were reviewed by me and considered in my medical decision making (see chart for details).     Well appearing 31 year old female with BRB per rectum secondary to bleeding internal hemorrhoids. Hemoglobin 13.3. Advised miralax and colace daily. Referral made to GI. Stable for discharge home. Patient verbalized understanding and agreement with plan.   Final Clinical Impressions(s) / ED Diagnoses   Final diagnoses:  Internal hemorrhoid, bleeding    New Prescriptions Discharge Medication List as of 12/31/2016  8:08 PM    START taking these medications   Details  docusate sodium (COLACE) 100 MG capsule Take 1 capsule (100 mg total) by mouth daily as needed., Starting Fri 12/31/2016, Until Sat 12/31/2017, Print    polyethylene glycol powder (GLYCOLAX) powder Take 17 g by mouth daily., Starting Fri  12/31/2016, Print         Tillman Sers, DO 12/31/16 2022    Blane Ohara, MD 12/31/16 [phone]

## 2016-12-31 NOTE — ED Triage Notes (Signed)
Pt states blood in stool for over a month, now passing clots per pt, bright red in color.  abd cramping with having a BM.

## 2018-02-27 ENCOUNTER — Emergency Department (HOSPITAL_COMMUNITY): Payer: Self-pay

## 2018-02-27 ENCOUNTER — Other Ambulatory Visit: Payer: Self-pay

## 2018-02-27 ENCOUNTER — Emergency Department (HOSPITAL_COMMUNITY)
Admission: EM | Admit: 2018-02-27 | Discharge: 2018-02-27 | Disposition: A | Discharge status: Home / Self Care | Payer: Self-pay | Attending: Emergency Medicine | Admitting: Emergency Medicine

## 2018-02-27 ENCOUNTER — Encounter (HOSPITAL_COMMUNITY): Payer: Self-pay | Admitting: Emergency Medicine

## 2018-02-27 DIAGNOSIS — F17210 Nicotine dependence, cigarettes, uncomplicated: Secondary | ICD-10-CM | POA: Insufficient documentation

## 2018-02-27 DIAGNOSIS — L03116 Cellulitis of left lower limb: Secondary | ICD-10-CM | POA: Insufficient documentation

## 2018-02-27 DIAGNOSIS — M898X6 Other specified disorders of bone, lower leg: Secondary | ICD-10-CM

## 2018-02-27 LAB — BASIC METABOLIC PANEL
Anion gap: 9 (ref 5–15)
BUN: 12 mg/dL (ref 6–20)
CALCIUM: 9.3 mg/dL (ref 8.9–10.3)
CO2: 25 mmol/L (ref 22–32)
Chloride: 104 mmol/L (ref 98–111)
Creatinine, Ser: 0.81 mg/dL (ref 0.44–1.00)
GFR calc non Af Amer: >60 mL/min (ref >60)
Glucose, Bld: 92 mg/dL (ref 70–99)
Potassium: 3.8 mmol/L (ref 3.5–5.1)
SODIUM: 138 mmol/L (ref 135–145)

## 2018-02-27 LAB — CBC WITH DIFFERENTIAL/PLATELET
Abs Immature Granulocytes: 0.03 10*3/uL (ref 0.00–0.07)
BASOS ABS: 0.1 10*3/uL (ref 0.0–0.1)
Basophils Relative: 1 %
EOS ABS: 0.2 10*3/uL (ref 0.0–0.5)
EOS PCT: 2 %
HCT: 45.8 % (ref 36.0–46.0)
Hemoglobin: 14.7 g/dL (ref 12.0–15.0)
Immature Granulocytes: 0 %
Lymphocytes Relative: 16 %
Lymphs Abs: 1.5 10*3/uL (ref 0.7–4.0)
MCH: 30.9 pg (ref 26.0–34.0)
MCHC: 32.1 g/dL (ref 30.0–36.0)
MCV: 96.4 fL (ref 80.0–100.0)
MONO ABS: 0.7 10*3/uL (ref 0.1–1.0)
Monocytes Relative: 7 %
NRBC: 0 % (ref 0.0–0.2)
Neutro Abs: 6.8 10*3/uL (ref 1.7–7.7)
Neutrophils Relative %: 74 %
Platelets: 275 10*3/uL (ref 150–400)
RBC: 4.75 MIL/uL (ref 3.87–5.11)
RDW: 12.4 % (ref 11.5–15.5)
WBC: 9.2 10*3/uL (ref 4.0–10.5)

## 2018-02-27 MED ORDER — DOXYCYCLINE HYCLATE 100 MG PO TABS
100.0000 mg | ORAL_TABLET | Freq: Once | ORAL | Status: AC
  Administered 2018-02-27: 100 mg via ORAL
  Filled 2018-02-27: qty 1

## 2018-02-27 MED ORDER — DOXYCYCLINE HYCLATE 100 MG PO TABS: 100.0000 mg | ORAL_TABLET | Freq: Two times a day (BID) | ORAL | 0 refills | Status: AC

## 2018-02-27 MED ORDER — NAPROXEN 250 MG PO TABS: 250.0000 mg | ORAL_TABLET | Freq: Two times a day (BID) | ORAL | 0 refills | Status: AC | PRN

## 2018-02-27 NOTE — ED Provider Notes (Signed)
[facility] Provider Note   CSN: [id number] Arrival date & time: 02/27/18  1626     History   Chief Complaint Chief Complaint  Patient presents with  . Leg Pain    HPI Gabriela Taylor is a 32 y.o. female.  HPI  Pt was seen at 2125. Per pt, c/o gradual onset and persistence of constant bilat anterior tibial areas "pain" for the past 1 month. Pt states she works "standing a lot." Pain worsens with palpation of the areas. Pt also c/o gradual onset and persistence of constant left lower thigh area "redness" for the past 3 days. Denies open wounds, no injury, no fevers, no other areas of rash, no focal motor weakness, no tingling/numbness in extremities, no back pain, no abd pain, no calf pain/swelling.     Past Medical History:  Diagnosis Date  . Depression   . Headache(784.0)   . Pregnancy induced hypertension   . Preterm labor   . Pseudoseizures     Patient Active Problem List   Diagnosis Date Noted  . Pregnancy, normal, incidental 09/02/2011    Past Surgical History:  Procedure Laterality Date  . CESAREAN SECTION       OB History    Gravida  2   Para  2   Term  1   Preterm  1   AB  0   Living  2     SAB  0   TAB  0   Ectopic  0   Multiple  0   Live Births  1            Home Medications    Prior to Admission medications   Medication Sig Start Date End Date Taking? Authorizing Provider  levonorgestrel (MIRENA) 20 MCG/24HR IUD 1 each by Intrauterine route once.   Yes [provider]    Family History Family History  Problem Relation Age of Onset  . Anesthesia problems Neg Hx   . Hypotension Neg Hx   . Malignant hyperthermia Neg Hx   . Pseudochol deficiency Neg Hx     Social History Social History   Tobacco Use  . Smoking status: Current Every Day Smoker    Packs/day: 1.00    Types: Cigarettes  . Smokeless tobacco: Never Used  Substance Use Topics  . 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   Review of Systems Review of Systems ROS: Statement: All systems negative except as marked or noted in the HPI; Constitutional: Negative for fever and chills. ; ; Eyes: Negative for eye pain, redness and discharge. ; ; ENMT: Negative for ear pain, hoarseness, nasal congestion, sinus pressure and sore throat. ; ; Cardiovascular: Negative for chest pain, palpitations, diaphoresis, dyspnea and peripheral edema. ; ; Respiratory: Negative for cough, wheezing and stridor. ; ; Gastrointestinal: Negative for nausea, vomiting, diarrhea, abdominal pain, blood in stool, hematemesis, jaundice and rectal bleeding. . ; ; Genitourinary: Negative for dysuria, flank pain and hematuria. ; ; Musculoskeletal: +anterior tibial areas pain. Negative for back pain and neck pain. Negative for swelling and trauma.; ; Skin: +rash. Negative for pruritus, abrasions, blisters, bruising and skin lesion.; ; Neuro: Negative for headache, lightheadedness and neck stiffness. Negative for weakness, altered level of consciousness, altered mental status, extremity  weakness, paresthesias, involuntary movement, seizure and syncope.      Physical Exam Updated Vital Signs BP (!) 103/51 (BP Location: Left Arm)   Pulse 79   Temp 98.1 F (36.7 C) (Oral)   Resp 16   SpO2 94%    Patient Vitals for the past 24 hrs:  BP Temp Temp src Pulse Resp SpO2  02/27/18 2155 (!) 103/51 - - 79 16 94 %  02/27/18 2130 (!) 114/46 - - - - -  02/27/18 1655 116/68 98.1 F (36.7 C) Oral 87 16 98 %     Physical Exam 2130: Physical examination:  Nursing notes reviewed; Vital signs and O2 SAT reviewed;  Constitutional: Well developed, Well nourished, Well hydrated, In no acute distress; Head:  Normocephalic, atraumatic; Eyes: EOMI, PERRL, No scleral icterus; ENMT: Mouth and  pharynx normal, Mucous membranes moist; Neck: Supple, Full range of motion, No lymphadenopathy; Cardiovascular: Regular rate and rhythm, No gallop; Respiratory: Breath sounds clear & equal bilaterally, No wheezes.  Speaking full sentences with ease, Normal respiratory effort/excursion; Chest: Nontender, Movement normal; Abdomen: Soft, Nontender, Nondistended, Normal bowel sounds; Genitourinary: No CVA tenderness; Extremities: Peripheral pulses normal, +approximately 4cm area of erythema to right lower thigh, no streaking, no soft tissue crepitus, no open wounds, no fluctuance, no drainage, no central pointing area. +TTP bilat mid-anterior tibial bone areas, no erythema, no ecchymosis, no soft tissue crepitus, no deformity. Bilat LE's muscles compartments soft. NT bilat knees/ankles/feet. No edema, No calf tenderness, edema or asymmetry.; Neuro: AA&Ox3, Major CN grossly intact.  Speech clear. No gross focal motor or sensory deficits in extremities.; Skin: Color normal, Warm, Dry.   ED Treatments / Results  Labs (all labs ordered are listed, but only abnormal results are displayed)   EKG None  Radiology   Procedures Procedures (including critical care time)  Medications Ordered in ED Medications  doxycycline (VIBRA-TABS) tablet 100 mg (has no administration in time range)     Initial Impression / Assessment and Plan / ED Course  I have reviewed the triage vital signs and the nursing notes.  Pertinent labs & imaging results that were available during my care of the patient were reviewed by me and considered in my medical decision making (see chart for details).  MDM Reviewed: previous chart, nursing note and vitals Reviewed previous: labs Interpretation: labs and x-ray   Results for orders placed or performed during the hospital encounter of 02/27/18  CBC with Differential  Result Value Ref Range   WBC 9.2 4.0 - 10.5 K/uL   RBC 4.75 3.87 - 5.11 MIL/uL   Hemoglobin 14.7 12.0 - 15.0  g/dL   HCT 16.1 09.6 - 04.5 %   MCV 96.4 80.0 - 100.0 fL   MCH 30.9 26.0 - 34.0 pg   MCHC 32.1 30.0 - 36.0 g/dL   RDW 40.9 81.1 - 91.4 %   Platelets 275 150 - 400 K/uL   nRBC 0.0 0.0 - 0.2 %   Neutrophils Relative % 74 %   Neutro Abs 6.8 1.7 - 7.7 K/uL   Lymphocytes Relative 16 %   Lymphs Abs 1.5 0.7 - 4.0 K/uL   Monocytes Relative 7 %   Monocytes Absolute 0.7 0.1 - 1.0 K/uL   Eosinophils Relative 2 %   Eosinophils Absolute 0.2 0.0 - 0.5 K/uL   Basophils Relative 1 %   Basophils Absolute 0.1 0.0 - 0.1 K/uL   Immature Granulocytes 0 %   Abs Immature Granulocytes 0.03 0.00 - 0.07 K/uL  Basic metabolic  panel  Result Value Ref Range   Sodium 138 135 - 145 mmol/L   Potassium 3.8 3.5 - 5.1 mmol/L   Chloride 104 98 - 111 mmol/L   CO2 25 22 - 32 mmol/L   Glucose, Bld 92 70 - 99 mg/dL   BUN 12 6 - 20 mg/dL   Creatinine, Ser 0.86 0.44 - 1.00 mg/dL   Calcium 9.3 8.9 - 57.8 mg/dL   GFR calc non Af Amer >60 >60 mL/min   GFR calc Af Amer >60 >60 mL/min   Anion gap 9 5 - 15   Dg Tibia/fibula Left Result Date: 02/27/2018 CLINICAL DATA:  Bilateral mid to distal anterior lower extremity pain. EXAM: LEFT TIBIA AND FIBULA - 2 VIEW COMPARISON:  None. FINDINGS: There is no evidence of fracture or other focal bone lesions. Soft tissues are unremarkable. IMPRESSION: Negative. Electronically Signed   By: Ted Mcalpine M.D.   On: 02/27/2018 22:17   Dg Tibia/fibula Right Result Date: 02/27/2018 CLINICAL DATA:  Bilateral mid to distal lower anterior extremity pain. EXAM: RIGHT TIBIA AND FIBULA - 2 VIEW COMPARISON:  None. FINDINGS: There is no evidence of fracture or other focal bone lesions. Soft tissues are unremarkable. IMPRESSION: Negative. Electronically Signed   By: Ted Mcalpine M.D.   On: 02/27/2018 22:18    2240:  XR and labs reassuring. Tx msk pain and cellulitis. Dx and testing d/w pt.  Questions answered.  Verb understanding, agreeable to d/c home with outpt f/u.   Final  Clinical Impressions(s) / ED Diagnoses   Final diagnoses:  None    ED Discharge Orders    None       Samuel Jester, DO 03/02/18 1630

## 2018-02-27 NOTE — ED Notes (Signed)
Patient transported to X-ray 

## 2018-02-27 NOTE — Discharge Instructions (Signed)
Take the prescriptions as directed.  Apply moist heat or ice to the area(s) of discomfort, for 15 minutes at a time, several times per day for the next few days.  Do not fall asleep on a heating or ice pack. Wear well supportive shoes.  Call your regular medical doctor tomorrow to schedule a follow up appointment this week. Call the Orthopedic doctor tomorrow to schedule a follow up appointment within the next week.  Return to the Emergency Department immediately if worsening.

## 2018-02-27 NOTE — ED Triage Notes (Signed)
Pt c/o bilateral lower leg pain x 1 month. Pt also c/o abscess to top of LT knee. Redness, edema, and warm to touch noted. Denies fever.

## 2021-10-03 DEATH — deceased
